# Patient Record
Sex: Female | Born: 1980 | Race: White | Hispanic: Yes | Marital: Married | Smoking: Former smoker
Health system: Southern US, Community
[De-identification: ages and names within clinical notes are randomized; demographics above are authoritative.]

## PROBLEM LIST (undated history)

## (undated) DIAGNOSIS — Z789 Other specified health status: Secondary | ICD-10-CM

## (undated) HISTORY — PX: BREAST SURGERY: SHX581

---

## 2014-12-16 LAB — OB RESULTS CONSOLE RPR: RPR: NONREACTIVE

## 2014-12-16 LAB — OB RESULTS CONSOLE HIV ANTIBODY (ROUTINE TESTING): HIV: NONREACTIVE

## 2014-12-16 LAB — OB RESULTS CONSOLE GC/CHLAMYDIA
CHLAMYDIA, DNA PROBE: NEGATIVE
Gonorrhea: NEGATIVE

## 2014-12-16 LAB — OB RESULTS CONSOLE ABO/RH: RH TYPE: NEGATIVE

## 2014-12-16 LAB — OB RESULTS CONSOLE HEPATITIS B SURFACE ANTIGEN: Hepatitis B Surface Ag: NEGATIVE

## 2014-12-16 LAB — OB RESULTS CONSOLE ANTIBODY SCREEN: Antibody Screen: NEGATIVE

## 2014-12-16 LAB — OB RESULTS CONSOLE RUBELLA ANTIBODY, IGM: Rubella: IMMUNE

## 2015-02-27 NOTE — L&D Delivery Note (Signed)
Pt complete and at +2 station with uncontrollable urge to push without pain medication.Marland Kitchen. Pt pushed for about 15 mins to deliver a viable female infant in ROA position over 2nd degree perineal laceration. Anterior and posterior shoulders spontaneously delivered with next two pushes; body easily followed next. Infant placed on mothers abdomen and bulb suction of mouth and nose performed. Cord was then clamped and cut by FOB after a minute delay. Cord blood obtained. Baby had a vigorous spontaneous cry noted. Placenta then delivered about 5 mins later intact, 3VC shultz. Fundal massage performed and pitocin per protocol. Fundus firm. Second degree lac repaired with 2-0 vicryl suture. Mother and baby stable. Counts correct. Apgars 9 and 9

## 2015-06-03 LAB — OB RESULTS CONSOLE GBS: STREP GROUP B AG: POSITIVE

## 2015-06-23 ENCOUNTER — Encounter (HOSPITAL_COMMUNITY): Payer: Self-pay | Admitting: *Deleted

## 2015-06-23 ENCOUNTER — Inpatient Hospital Stay (HOSPITAL_COMMUNITY)
Admission: AD | Admit: 2015-06-23 | Discharge: 2015-06-23 | Disposition: A | Discharge status: Home / Self Care | Payer: BLUE CROSS/BLUE SHIELD | Source: Intra-hospital | Attending: Obstetrics and Gynecology | Admitting: Obstetrics and Gynecology

## 2015-06-23 DIAGNOSIS — Z79899 Other long term (current) drug therapy: Secondary | ICD-10-CM | POA: Diagnosis not present

## 2015-06-23 DIAGNOSIS — B349 Viral infection, unspecified: Secondary | ICD-10-CM | POA: Diagnosis not present

## 2015-06-23 DIAGNOSIS — O98513 Other viral diseases complicating pregnancy, third trimester: Secondary | ICD-10-CM | POA: Insufficient documentation

## 2015-06-23 DIAGNOSIS — O479 False labor, unspecified: Secondary | ICD-10-CM

## 2015-06-23 DIAGNOSIS — R509 Fever, unspecified: Secondary | ICD-10-CM | POA: Diagnosis present

## 2015-06-23 DIAGNOSIS — Z87891 Personal history of nicotine dependence: Secondary | ICD-10-CM | POA: Insufficient documentation

## 2015-06-23 DIAGNOSIS — O471 False labor at or after 37 completed weeks of gestation: Secondary | ICD-10-CM | POA: Diagnosis not present

## 2015-06-23 DIAGNOSIS — Z3A37 37 weeks gestation of pregnancy: Secondary | ICD-10-CM | POA: Insufficient documentation

## 2015-06-23 HISTORY — DX: Other specified health status: Z78.9

## 2015-06-23 LAB — CBC WITH DIFFERENTIAL/PLATELET
BASOS ABS: 0 10*3/uL (ref 0.0–0.1)
BASOS PCT: 0 %
Eosinophils Absolute: 0 10*3/uL (ref 0.0–0.7)
Eosinophils Relative: 0 %
HEMATOCRIT: 33 % — AB (ref 36.0–46.0)
Hemoglobin: 11.4 g/dL — ABNORMAL LOW (ref 12.0–15.0)
Lymphocytes Relative: 7 %
Lymphs Abs: 0.9 10*3/uL (ref 0.7–4.0)
MCH: 30.2 pg (ref 26.0–34.0)
MCHC: 34.5 g/dL (ref 30.0–36.0)
MCV: 87.3 fL (ref 78.0–100.0)
Monocytes Absolute: 0.5 10*3/uL (ref 0.1–1.0)
Monocytes Relative: 4 %
NEUTROS ABS: 11.3 10*3/uL — AB (ref 1.7–7.7)
Neutrophils Relative %: 89 %
PLATELETS: 196 10*3/uL (ref 150–400)
RBC: 3.78 MIL/uL — AB (ref 3.87–5.11)
RDW: 13.5 % (ref 11.5–15.5)
WBC: 12.8 10*3/uL — AB (ref 4.0–10.5)

## 2015-06-23 LAB — URINALYSIS, ROUTINE W REFLEX MICROSCOPIC
Bilirubin Urine: NEGATIVE
GLUCOSE, UA: NEGATIVE mg/dL
Hgb urine dipstick: NEGATIVE
KETONES UR: 40 mg/dL — AB
LEUKOCYTES UA: NEGATIVE
NITRITE: NEGATIVE
PH: 6.5 (ref 5.0–8.0)
PROTEIN: NEGATIVE mg/dL
Specific Gravity, Urine: 1.01 (ref 1.005–1.030)

## 2015-06-23 MED ORDER — ONDANSETRON HCL 4 MG/2ML IJ SOLN
4.0000 mg | Freq: Once | INTRAMUSCULAR | Status: AC
  Administered 2015-06-23: 4 mg via INTRAVENOUS
  Filled 2015-06-23: qty 2

## 2015-06-23 MED ORDER — ONDANSETRON 8 MG PO TBDP: 8.0000 mg | ORAL_TABLET | Freq: Three times a day (TID) | ORAL | Status: DC | PRN

## 2015-06-23 MED ORDER — LACTATED RINGERS IV BOLUS (SEPSIS)
1000.0000 mL | Freq: Once | INTRAVENOUS | Status: AC
  Administered 2015-06-23: 1000 mL via INTRAVENOUS

## 2015-06-23 NOTE — MAU Note (Signed)
Patient presents at [redacted] weeks gestation with c/o irregular contractions X 2 days. Fetus active. Denies bleeding or discharge.

## 2015-06-23 NOTE — MAU Provider Note (Signed)
Chief Complaint:  Labor Eval   First Provider Initiated Contact with Patient 06/23/15 1704     HPI: Cindy Hendricks is a 35 y.o. G3P1011 at [redacted]w[redacted]d who presents to maternity admissions reporting contractions, low-grade fever, chills, decreased appetite x 2 days and nausea today. Able to drink water, but not much PO intake otherwise. Took Tylenol this morning at 0800.   Location: Low abd, low back Quality: cramping Severity: 8/10 in pain scale Duration: 2 days Course: worsening Timing: intermittent, every 4-5 minutes Modifying factors: Nothing. Hasn't tried anything for pain. Associated signs and symptoms: Pos for low grade fever, chills, body aches, nausea. Neg for LOF, VB, uterine tenderness, sore throat, respiratory complaints, diarrhea, constipation or other Sx of infection.   Good fetal movement.   Past Medical History: Past Medical History  Diagnosis Date  . Medical history non-contributory     Past obstetric history: OB History  Gravida Para Term Preterm AB SAB TAB Ectopic Multiple Living  # Outcome Date GA Lbr Len/2nd Weight Sex Delivery Anes PTL Lv  3 Current           2 Term 03/26/12     CS-LTranv     1 Ectopic 1999              Past Surgical History: Past Surgical History  Procedure Laterality Date  . Breast surgery    . Cesarean section       Family History: History reviewed. No pertinent family history.  Social History: Social History  Substance Use Topics  . Smoking status: Former Games developer  . Smokeless tobacco: Never Used  . Alcohol Use: No    Allergies: No Known Allergies  Meds:  Prescriptions prior to admission  Medication Sig Dispense Refill Last Dose  . acetaminophen (TYLENOL) 500 MG tablet Take 1,000 mg by mouth every 6 (six) hours as needed for mild pain or fever.    06/23/2015 at Unknown time  . ferrous sulfate 325 (65 FE) MG tablet Take 325 mg by mouth daily with breakfast.   06/23/2015 at Unknown time  . Prenatal  Vit-Fe Fumarate-FA (PRENATAL MULTIVITAMIN) TABS tablet Take 1 tablet by mouth daily at 12 noon.   06/23/2015 at Unknown time    I have reviewed patient's Past Medical Hx, Surgical Hx, Family Hx, Social Hx, medications and allergies.   ROS:  Review of Systems  Constitutional: Positive for fever, chills, appetite change and fatigue.  HENT: Negative for congestion, ear pain, rhinorrhea, sinus pressure and sore throat.   Respiratory: Negative for cough.   Gastrointestinal: Positive for nausea and abdominal pain. Negative for vomiting, diarrhea and constipation.  Genitourinary: Negative for dysuria, hematuria, flank pain, vaginal bleeding and vaginal discharge.  Musculoskeletal: Positive for myalgias and back pain (midline low back). Negative for neck pain.  Neurological: Negative for headaches.    Physical Exam   Patient Vitals for the past 24 hrs:  BP Temp Temp src Pulse Resp Height Weight  06/23/15 1823 (!) 103/54 mmHg 99.2 F (37.3 C) Oral 117 20 - -  06/23/15 1625 112/77 mmHg 99 F (37.2 C) Oral (!) 124 24  (1.575 m) 129 lb (58.514 kg)   Constitutional: Well-developed, well-nourished female in no acute distress. Tired-appearing.  Cardiovascular: Mild tachycardia Respiratory: normal effort GI: Abd soft, non-tender, gravid appropriate for gestational age.  MS: Extremities nontender, no edema, normal ROM. Mild sacral TTP.  Neurologic: Alert and oriented x 4.  GU: Neg CVAT.  Pelvic: NEFG, physiologic discharge, no blood.  Dilation: 1 Effacement (%): 50 Cervical Position: Middle Station:  (high) Presentation: Vertex Exam by:: Dorathy KinsmanVirginia Phoenyx Melka, CNM  FHT:  Baseline 150-170's , moderate variability, 15x15 accelerations present, no decelerations. Period of prolonged accels after vomiting. Returned to baseline on 150's.  Contractions: q 3-5 mins, moderate   Labs: Results for orders placed or performed during the hospital encounter of 06/23/15 (from the past 24 hour(s))  CBC with  Differential/Platelet     Status: Abnormal   Collection Time: 06/23/15  4:55 PM  Result Value Ref Range   WBC 12.8 (H) 4.0 - 10.5 K/uL   RBC 3.78 (L) 3.87 - 5.11 MIL/uL   Hemoglobin 11.4 (L) 12.0 - 15.0 g/dL   HCT 11.933.0 (L) 14.736.0 - 82.946.0 %   MCV 87.3 78.0 - 100.0 fL   MCH 30.2 26.0 - 34.0 pg   MCHC 34.5 30.0 - 36.0 g/dL   RDW 56.213.5 13.011.5 - 86.515.5 %   Platelets 196 150 - 400 K/uL   Neutrophils Relative % 89 %   Neutro Abs 11.3 (H) 1.7 - 7.7 K/uL   Lymphocytes Relative 7 %   Lymphs Abs 0.9 0.7 - 4.0 K/uL   Monocytes Relative 4 %   Monocytes Absolute 0.5 0.1 - 1.0 K/uL   Eosinophils Relative 0 %   Eosinophils Absolute 0.0 0.0 - 0.7 K/uL   Basophils Relative 0 %   Basophils Absolute 0.0 0.0 - 0.1 K/uL  Urinalysis, Routine w reflex microscopic (not [facility])     Status: Abnormal   Collection Time: 06/23/15  5:10 PM  Result Value Ref Range   Color, Urine YELLOW YELLOW   APPearance CLEAR CLEAR   Specific Gravity, Urine 1.010 1.005 - 1.030   pH 6.5 5.0 - 8.0   Glucose, UA NEGATIVE NEGATIVE mg/dL   Hgb urine dipstick NEGATIVE NEGATIVE   Bilirubin Urine NEGATIVE NEGATIVE   Ketones, ur 40 (A) NEGATIVE mg/dL   Protein, ur NEGATIVE NEGATIVE mg/dL   Nitrite NEGATIVE NEGATIVE   Leukocytes, UA NEGATIVE NEGATIVE    Imaging:  No results found.  MAU Course: Prolonged monitoring, CBC w/ dif, UA, LR bolus, Zofran.  No cervical change. Vomited once. Discussed Hx, exam, labs, VE, FHR tracing w/ Dr. Ellyn HackBovard who reviewed the tracing remotely. Suspect low-grade fever if from viral syndrome. Low suspicion for chorio or other emergent condition. Will not swab for flu to to absence of ILI Sx. D/C home w/ Phenergan or Zofran.   MDM: - Suspect low-grade fever if from viral syndrome. Low suspicion for chorio or other emergent condition. Mild leukocytosis normal for pregnancy.  - Fetal tachycardia resolved w/ fluid bolus.  - Braxton Hicks contractions. No evidence of active labor.   Assessment: 1. Fetal  tachycardia before the onset of labor   2. Acute viral syndrome   3. Braxton Hicks contractions     Plan: Discharge home in stable condition per consult w/ Dr. Ellyn HackBovard.  Labor precautions and fetal kick counts. Return to [facility] if unable to control fever w/ Tylenol.  ROM/Chorio precautions.      Follow-up Information    Follow up with Sherian ReinBovard-Stuckert, Jody, MD.   Specialty:  Obstetrics and Gynecology   Why:  Routine prenatal visit or sooner as needed if symptoms worsen   Contact information:   [address] [place] [address] [phone]       [facility] with [facility].  Why:  As needed if symptoms worsen   Contact information:   [address] [id number] mc [place] [address] [phone]        Medication List    TAKE these medications        acetaminophen 500 MG tablet  Commonly known as:  TYLENOL  Take 1,000 mg by mouth every 6 (six) hours as needed for mild pain or fever.     ferrous sulfate 325 (65 FE) MG tablet  Take 325 mg by mouth daily with breakfast.     ondansetron 8 MG disintegrating tablet  Commonly known as:  ZOFRAN ODT  Take 1 tablet (8 mg total) by mouth every 8 (eight) hours as needed for nausea or vomiting.     prenatal multivitamin Tabs tablet  Take 1 tablet by mouth daily at 12 noon.        [place], [place] 06/23/2015 6:53 PM

## 2015-06-23 NOTE — MAU Note (Signed)
Patient states she has been running a fever for the past couple of days and is taking tylenol prn.

## 2015-06-23 NOTE — Discharge Instructions (Signed)
Braxton Hicks Contractions Contractions of the uterus can occur throughout pregnancy. Contractions are not always a sign that you are in labor.  WHAT ARE BRAXTON HICKS CONTRACTIONS?  Contractions that occur before labor are called Braxton Hicks contractions, or false labor. Toward the end of pregnancy (32-34 weeks), these contractions can develop more often and may become more forceful. This is not true labor because these contractions do not result in opening (dilatation) and thinning of the cervix. They are sometimes difficult to tell apart from true labor because these contractions can be forceful and people have different pain tolerances. You should not feel embarrassed if you go to the hospital with false labor. Sometimes, the only way to tell if you are in true labor is for your health care provider to look for changes in the cervix. If there are no prenatal problems or other health problems associated with the pregnancy, it is completely safe to be sent home with false labor and await the onset of true labor. HOW CAN YOU TELL THE DIFFERENCE BETWEEN TRUE AND FALSE LABOR? False Labor  The contractions of false labor are usually shorter and not as hard as those of true labor.   The contractions are usually irregular.   The contractions are often felt in the front of the lower abdomen and in the groin.   The contractions may go away when you walk around or change positions while lying down.   The contractions get weaker and are shorter lasting as time goes on.   The contractions do not usually become progressively stronger, regular, and closer together as with true labor.  True Labor  Contractions in true labor last 30-70 seconds, become very regular, usually become more intense, and increase in frequency.   The contractions do not go away with walking.   The discomfort is usually felt in the top of the uterus and spreads to the lower abdomen and low back.   True labor can be  determined by your health care provider with an exam. This will show that the cervix is dilating and getting thinner.  WHAT TO REMEMBER  Keep up with your usual exercises and follow other instructions given by your health care provider.   Take medicines as directed by your health care provider.   Keep your regular prenatal appointments.   Eat and drink lightly if you think you are going into labor.   If Braxton Hicks contractions are making you uncomfortable:   Change your position from lying down or resting to walking, or from walking to resting.   Sit and rest in a tub of warm water.   Drink 2-3 glasses of water. Dehydration may cause these contractions.   Do slow and deep breathing several times an hour.  WHEN SHOULD I SEEK IMMEDIATE MEDICAL CARE? Seek immediate medical care if:  Your contractions become stronger, more regular, and closer together.   You have fluid leaking or gushing from your vagina.   You have a fever.   You pass blood-tinged mucus.   You have vaginal bleeding.   You have continuous abdominal pain.   You have low back pain that you never had before.   You feel your baby's head pushing down and causing pelvic pressure.   Your baby is not moving as much as it used to.    This information is not intended to replace advice given to you by your health care provider. Make sure you discuss any questions you have with your health care  provider. °  °Document Released: 02/12/2005 Document Revised: 02/17/2013 Document Reviewed: 11/24/2012 °Elsevier Interactive Patient Education ©2016 Elsevier Inc. ° °Viral Gastroenteritis °Viral gastroenteritis is also known as stomach flu. This condition affects the stomach and intestinal tract. It can cause sudden diarrhea and vomiting. The illness typically lasts 3 to 8 days. Most people develop an immune response that eventually gets rid of the virus. While this natural response develops, the virus can make  you quite ill. °CAUSES  °Many different viruses can cause gastroenteritis, such as rotavirus or noroviruses. You can catch one of these viruses by consuming contaminated food or water. You may also catch a virus by sharing utensils or other personal items with an infected person or by touching a contaminated surface. °SYMPTOMS  °The most common symptoms are diarrhea and vomiting. These problems can cause a severe loss of body fluids (dehydration) and a body salt (electrolyte) imbalance. Other symptoms may include: °· Fever. °· Headache. °· Fatigue. °· Abdominal pain. °DIAGNOSIS  °Your caregiver can usually diagnose viral gastroenteritis based on your symptoms and a physical exam. A stool sample may also be taken to test for the presence of viruses or other infections. °TREATMENT  °This illness typically goes away on its own. Treatments are aimed at rehydration. The most serious cases of viral gastroenteritis involve vomiting so severely that you are not able to keep fluids down. In these cases, fluids must be given through an intravenous line (IV). °HOME CARE INSTRUCTIONS  °· Drink enough fluids to keep your urine clear or pale yellow. Drink small amounts of fluids frequently and increase the amounts as tolerated. °· Ask your caregiver for specific rehydration instructions. °· Avoid: °¨ Foods high in sugar. °¨ Alcohol. °¨ Carbonated drinks. °¨ Tobacco. °¨ Juice. °¨ Caffeine drinks. °¨ Extremely hot or cold fluids. °¨ Fatty, greasy foods. °¨ Too much intake of anything at one time. °¨ Dairy products until 24 to 48 hours after diarrhea stops. °· You may consume probiotics. Probiotics are active cultures of beneficial bacteria. They may lessen the amount and number of diarrheal stools in adults. Probiotics can be found in yogurt with active cultures and in supplements. °· Wash your hands well to avoid spreading the virus. °· Only take over-the-counter or prescription medicines for pain, discomfort, or fever as directed  by your caregiver. Do not give aspirin to children. Antidiarrheal medicines are not recommended. °· Ask your caregiver if you should continue to take your regular prescribed and over-the-counter medicines. °· Keep all follow-up appointments as directed by your caregiver. °SEEK IMMEDIATE MEDICAL CARE IF:  °· You are unable to keep fluids down. °· You do not urinate at least once every 6 to 8 hours. °· You develop shortness of breath. °· You notice blood in your stool or vomit. This may look like coffee grounds. °· You have abdominal pain that increases or is concentrated in one small area (localized). °· You have persistent vomiting or diarrhea. °· You have a fever. °· The patient is a child younger than 3 months, and he or she has a fever. °· The patient is a child older than 3 months, and he or she has a fever and persistent symptoms. °· The patient is a child older than 3 months, and he or she has a fever and symptoms suddenly get worse. °· The patient is a baby, and he or she has no tears when crying. °MAKE SURE YOU:  °· Understand these instructions. °· Will watch your condition. °· Will get   help right away if you are not doing well or get worse.   This information is not intended to replace advice given to you by your health care provider. Make sure you discuss any questions you have with your health care provider.   Document Released: 02/12/2005 Document Revised: 05/07/2011 Document Reviewed: 11/29/2010 Elsevier Interactive Patient Education [employer].

## 2015-06-28 ENCOUNTER — Encounter (HOSPITAL_COMMUNITY): Payer: Self-pay | Admitting: *Deleted

## 2015-06-28 ENCOUNTER — Inpatient Hospital Stay (HOSPITAL_COMMUNITY)
Admission: AD | Admit: 2015-06-28 | Discharge: 2015-06-30 | DRG: 775 | Disposition: A | Discharge status: Home / Self Care | Payer: BLUE CROSS/BLUE SHIELD | Source: Ambulatory Visit | Attending: Obstetrics and Gynecology | Admitting: Obstetrics and Gynecology

## 2015-06-28 DIAGNOSIS — Z3A38 38 weeks gestation of pregnancy: Secondary | ICD-10-CM | POA: Diagnosis not present

## 2015-06-28 DIAGNOSIS — O99824 Streptococcus B carrier state complicating childbirth: Secondary | ICD-10-CM | POA: Diagnosis present

## 2015-06-28 DIAGNOSIS — D649 Anemia, unspecified: Secondary | ICD-10-CM | POA: Diagnosis present

## 2015-06-28 DIAGNOSIS — Z87891 Personal history of nicotine dependence: Secondary | ICD-10-CM

## 2015-06-28 DIAGNOSIS — O9902 Anemia complicating childbirth: Secondary | ICD-10-CM | POA: Diagnosis present

## 2015-06-28 DIAGNOSIS — O34211 Maternal care for low transverse scar from previous cesarean delivery: Secondary | ICD-10-CM | POA: Diagnosis present

## 2015-06-28 DIAGNOSIS — O34219 Maternal care for unspecified type scar from previous cesarean delivery: Secondary | ICD-10-CM | POA: Diagnosis present

## 2015-06-28 LAB — CBC
HCT: 33.1 % — ABNORMAL LOW (ref 36.0–46.0)
HCT: 23.8 % — ABNORMAL LOW (ref 36.0–46.0)
HEMOGLOBIN: 11.5 g/dL — AB (ref 12.0–15.0)
Hemoglobin: 8.3 g/dL — ABNORMAL LOW (ref 12.0–15.0)
MCH: 29.9 pg (ref 26.0–34.0)
MCH: 30 pg (ref 26.0–34.0)
MCHC: 34.7 g/dL (ref 30.0–36.0)
MCHC: 34.9 g/dL (ref 30.0–36.0)
MCV: 86.2 fL (ref 78.0–100.0)
MCV: 85.9 fL (ref 78.0–100.0)
PLATELETS: 157 10*3/uL (ref 150–400)
Platelets: 236 10*3/uL (ref 150–400)
RBC: 3.84 MIL/uL — AB (ref 3.87–5.11)
RBC: 2.77 MIL/uL — ABNORMAL LOW (ref 3.87–5.11)
RDW: 13.2 % (ref 11.5–15.5)
RDW: 13 % (ref 11.5–15.5)
WBC: 10.5 10*3/uL (ref 4.0–10.5)
WBC: 16.5 10*3/uL — AB (ref 4.0–10.5)

## 2015-06-28 LAB — RPR: RPR: NONREACTIVE

## 2015-06-28 LAB — TYPE AND SCREEN
ABO/RH(D): A NEG
Antibody Screen: NEGATIVE

## 2015-06-28 LAB — ABO/RH: ABO/RH(D): A NEG

## 2015-06-28 LAB — POCT FERN TEST: POCT Fern Test: POSITIVE

## 2015-06-28 MED ORDER — LIDOCAINE HCL (PF) 1 % IJ SOLN
30.0000 mL | INTRAMUSCULAR | Status: AC | PRN
  Administered 2015-06-28: 30 mL via SUBCUTANEOUS
  Filled 2015-06-28: qty 30

## 2015-06-28 MED ORDER — PHENYLEPHRINE 40 MCG/ML (10ML) SYRINGE FOR IV PUSH (FOR BLOOD PRESSURE SUPPORT)
PREFILLED_SYRINGE | INTRAVENOUS | Status: AC
  Filled 2015-06-28: qty 20

## 2015-06-28 MED ORDER — FENTANYL 2.5 MCG/ML BUPIVACAINE 1/10 % EPIDURAL INFUSION (WH - ANES)
INTRAMUSCULAR | Status: AC
  Filled 2015-06-28: qty 125

## 2015-06-28 MED ORDER — DIBUCAINE 1 % RE OINT: 1.0000 "application " | TOPICAL_OINTMENT | RECTAL | Status: DC | PRN

## 2015-06-28 MED ORDER — EPHEDRINE 5 MG/ML INJ
10.0000 mg | INTRAVENOUS | Status: DC | PRN
  Filled 2015-06-28: qty 2

## 2015-06-28 MED ORDER — IBUPROFEN 600 MG PO TABS
600.0000 mg | ORAL_TABLET | Freq: Four times a day (QID) | ORAL | Status: DC
  Administered 2015-06-28 – 2015-06-30 (×9): 600 mg via ORAL
  Filled 2015-06-28 (×9): qty 1

## 2015-06-28 MED ORDER — DIPHENHYDRAMINE HCL 25 MG PO CAPS: 25.0000 mg | ORAL_CAPSULE | Freq: Four times a day (QID) | ORAL | Status: DC | PRN

## 2015-06-28 MED ORDER — FENTANYL 2.5 MCG/ML BUPIVACAINE 1/10 % EPIDURAL INFUSION (WH - ANES): 14.0000 mL/h | INTRAMUSCULAR | Status: DC | PRN

## 2015-06-28 MED ORDER — ONDANSETRON HCL 4 MG/2ML IJ SOLN
4.0000 mg | Freq: Four times a day (QID) | INTRAMUSCULAR | Status: DC | PRN
  Filled 2015-06-28: qty 2

## 2015-06-28 MED ORDER — LACTATED RINGERS IV SOLN: INTRAVENOUS | Status: DC

## 2015-06-28 MED ORDER — OXYCODONE-ACETAMINOPHEN 5-325 MG PO TABS: 2.0000 | ORAL_TABLET | ORAL | Status: DC | PRN

## 2015-06-28 MED ORDER — IBUPROFEN 600 MG PO TABS: 600.0000 mg | ORAL_TABLET | Freq: Four times a day (QID) | ORAL | Status: DC | PRN

## 2015-06-28 MED ORDER — LACTATED RINGERS IV SOLN: 500.0000 mL | Freq: Once | INTRAVENOUS | Status: DC

## 2015-06-28 MED ORDER — WITCH HAZEL-GLYCERIN EX PADS: 1.0000 "application " | MEDICATED_PAD | CUTANEOUS | Status: DC | PRN

## 2015-06-28 MED ORDER — OXYTOCIN BOLUS FROM INFUSION: 500.0000 mL | INTRAVENOUS | Status: DC

## 2015-06-28 MED ORDER — ACETAMINOPHEN 325 MG PO TABS: 650.0000 mg | ORAL_TABLET | ORAL | Status: DC | PRN

## 2015-06-28 MED ORDER — SIMETHICONE 80 MG PO CHEW
80.0000 mg | CHEWABLE_TABLET | ORAL | Status: DC | PRN
Start: 2015-06-28 — End: 2015-06-30

## 2015-06-28 MED ORDER — PENICILLIN G POTASSIUM 5000000 UNITS IJ SOLR
2.5000 10*6.[IU] | INTRAVENOUS | Status: DC
  Filled 2015-06-28 (×2): qty 2.5

## 2015-06-28 MED ORDER — LACTATED RINGERS IV SOLN
500.0000 mL | INTRAVENOUS | Status: DC | PRN
  Administered 2015-06-28: 500 mL via INTRAVENOUS

## 2015-06-28 MED ORDER — PHENYLEPHRINE 40 MCG/ML (10ML) SYRINGE FOR IV PUSH (FOR BLOOD PRESSURE SUPPORT)
80.0000 ug | PREFILLED_SYRINGE | INTRAVENOUS | Status: DC | PRN
  Filled 2015-06-28: qty 5

## 2015-06-28 MED ORDER — SENNOSIDES-DOCUSATE SODIUM 8.6-50 MG PO TABS
2.0000 | ORAL_TABLET | ORAL | Status: DC
  Administered 2015-06-28 – 2015-06-29 (×2): 2 via ORAL
  Filled 2015-06-28 (×2): qty 2

## 2015-06-28 MED ORDER — OXYTOCIN 10 UNIT/ML IJ SOLN
2.5000 [IU]/h | INTRAVENOUS | Status: DC
  Administered 2015-06-28: 05:00:00 via INTRAVENOUS
  Filled 2015-06-28: qty 4

## 2015-06-28 MED ORDER — AMPICILLIN SODIUM 2 G IJ SOLR
2.0000 g | Freq: Once | INTRAMUSCULAR | Status: DC
  Filled 2015-06-28: qty 2000

## 2015-06-28 MED ORDER — DIPHENHYDRAMINE HCL 50 MG/ML IJ SOLN: 12.5000 mg | INTRAMUSCULAR | Status: DC | PRN

## 2015-06-28 MED ORDER — PRENATAL MULTIVITAMIN CH
1.0000 | ORAL_TABLET | Freq: Every day | ORAL | Status: DC
  Administered 2015-06-28 – 2015-06-30 (×3): 1 via ORAL
  Filled 2015-06-28 (×3): qty 1

## 2015-06-28 MED ORDER — TETANUS-DIPHTH-ACELL PERTUSSIS 5-2.5-18.5 LF-MCG/0.5 IM SUSP: 0.5000 mL | Freq: Once | INTRAMUSCULAR | Status: DC

## 2015-06-28 MED ORDER — DEXTROSE 5 % IV SOLN
5.0000 10*6.[IU] | Freq: Once | INTRAVENOUS | Status: AC
  Administered 2015-06-28: 5 10*6.[IU] via INTRAVENOUS
  Filled 2015-06-28: qty 5

## 2015-06-28 MED ORDER — SODIUM CHLORIDE 0.9 % IV SOLN
2.0000 g | Freq: Once | INTRAVENOUS | Status: DC
  Filled 2015-06-28: qty 2000

## 2015-06-28 MED ORDER — ZOLPIDEM TARTRATE 5 MG PO TABS: 5.0000 mg | ORAL_TABLET | Freq: Every evening | ORAL | Status: DC | PRN

## 2015-06-28 MED ORDER — ONDANSETRON HCL 4 MG PO TABS: 4.0000 mg | ORAL_TABLET | ORAL | Status: DC | PRN

## 2015-06-28 MED ORDER — OXYCODONE-ACETAMINOPHEN 5-325 MG PO TABS
1.0000 | ORAL_TABLET | ORAL | Status: DC | PRN
  Administered 2015-06-28: 1 via ORAL
  Filled 2015-06-28: qty 1

## 2015-06-28 MED ORDER — ONDANSETRON HCL 4 MG/2ML IJ SOLN: 4.0000 mg | INTRAMUSCULAR | Status: DC | PRN

## 2015-06-28 MED ORDER — COCONUT OIL OIL: 1.0000 "application " | TOPICAL_OIL | Status: DC | PRN

## 2015-06-28 MED ORDER — BENZOCAINE-MENTHOL 20-0.5 % EX AERO
1.0000 "application " | INHALATION_SPRAY | CUTANEOUS | Status: DC | PRN
  Administered 2015-06-28: 1 via TOPICAL
  Filled 2015-06-28: qty 56

## 2015-06-28 MED ORDER — CITRIC ACID-SODIUM CITRATE 334-500 MG/5ML PO SOLN: 30.0000 mL | ORAL | Status: DC | PRN

## 2015-06-28 NOTE — Lactation Note (Addendum)
This note was copied from a baby's chart. Lactation Consultation Note  Patient Name: Cindy Everlene BallsLaura Pereira Conde ZOXWR'UToday's Date: 06/28/2015 Reason for consult: Initial assessment   With this mom of a term baby, now 6 hours old and birth weight 5 lbs. 14 oz. Baby was asleep skin to skin, with mom. Mom allowed me to assist her with latching the baby, after hand expression. The baby latched after a minute and stayed latched for 20 minutes, with good, rhythmic suckles and visible swallows, needing stimulation to continue suckles.Mom encouraged to attempt to feed the baby at least every 3 hours, and ore often if baby showing hunger cues.  Basic teaching done on breast feeding from the Baby and me book, and lactation folder. DEP set up, for mom to begin pumping, to protect her milk supply and provide EBm for the baby, if she begins to get too tired to feed. I fitted mom with 21 flanges, and showed her how to set initiation setting on pump. Mom will call after doing skin to skin, if she needs help with pumping. Mom advised to pump for 15 minutes, every 3 hours, and offer EBm to baby. Mode of feeding this milk not discussed yet. Mom knows to call for questions/concerns.                  Important to note, this mom had breast implants done 'Years ago". and has scars where her areolas were removed, and a scar under her breast on the left side ( did not see the right at this time). Mom reports she did have milk with her 153 year old, but had to stop breast feeding when her husband was in an accident, and she lost her supply.    Maternal Data Formula Feeding for Exclusion: No Has patient been taught Hand Expression?: Yes Does the patient have breastfeeding experience prior to this delivery?: Yes  Feeding Feeding Type: Breast Fed Length of feed: 20 min  LATCH Score/Interventions Latch: Repeated attempts needed to sustain latch, nipple held in mouth throughout feeding, stimulation needed to elicit sucking  reflex. Intervention(s): Adjust position;Assist with latch;Breast compression  Audible Swallowing: A few with stimulation (easily expressed drops of colostrum with hand expression) Intervention(s): Skin to skin;Hand expression  Type of Nipple: Everted at rest and after stimulation (small, short shaft, breast scar around areola from breast implants years age, a per mom)  Comfort (Breast/Nipple): Soft / non-tender     Hold (Positioning): Assistance needed to correctly position infant at breast and maintain latch. Intervention(s): Breastfeeding basics reviewed;Support Pillows;Position options;Skin to skin  LATCH Score: 7  Lactation Tools Discussed/Used Pump Review: Setup, frequency, and cleaning;Milk Storage;Other (comment) (hand expression and initiation setting) Initiated by:: c Sabra Hecklee Rn, IBCLC Date initiated:: 06/28/15   Consult Status Consult Status: Follow-up Date: 06/29/15 Follow-up type: In-patient    Alfred LevinsLee, Symphoni Helbling Anne 06/28/2015, 12:20 PM

## 2015-06-28 NOTE — MAU Note (Signed)
PT SAYS SHE STARTED  HURTING  BAD AT .     VE -    1 CM.   GBS- POSITIVE      WANTS    VAG DEL.   OTHER BABY   IS C/S.

## 2015-06-28 NOTE — Procedures (Signed)
Pt oob to bathroom voided sufficient amount fundus remained firm but had a steady trickle of bld. Pt became light headed transferred back to bed via steady restarted fluids vital signs now stable pt feeling better. Fundus firm minimal bleeding

## 2015-06-28 NOTE — Progress Notes (Signed)
Patient ID: Cindy Hendricks, female   DOB: 11-21-1980, 35 y.o.   MRN: [id number] Pt had two episodes of lightheadness when attempt made to have her stand.  Bleeding noted to be appropriate and fundus firm Slight decrease in BP noted when checked from laying to sitting but not enough for Positive tilt test Pt encouraged to eat/drink Recheck done with her sitting about 30-1945mmins later and results even more stable Ok to move to pp room.  Continue fall precautions

## 2015-06-28 NOTE — H&P (Signed)
Cindy BallsLaura Pereira Hendricks is a 35 y.o. female presenting for painful contractions. Pt states has had intermittent irregular contractions for at least a week however since 2am today (2-3hours ago) they begun to get regular and painful. Pt also states has had some slight leakage of fluid since 3pm yesterday - no gush - so was unsure if water broke; in MAU pt is found to be + for ferning. She reiterates desire for VBAC  Maternal Medical History:  Reason for admission: Rupture of membranes and contractions.  Nausea.  Contractions: Onset was 1-2 hours ago.   Frequency: regular.   Duration is approximately 45 seconds.   Perceived severity is strong.    Fetal activity: Perceived fetal activity is normal.   Last perceived fetal movement was within the past hour.    Prenatal complications: no prenatal complications Prenatal Complications - Diabetes: none.    OB History    Gravida Para Term Preterm AB TAB SAB Ectopic Multiple Living   3 1 1  1   1  1      Past Medical History  Diagnosis Date  . Medical history non-contributory    Past Surgical History  Procedure Laterality Date  . Breast surgery    . Cesarean section     Family History: family history is not on file. Social History:  reports that she has quit smoking. She has never used smokeless tobacco. She reports that she does not drink alcohol or use illicit drugs.   Prenatal Transfer Tool  Maternal Diabetes: No Genetic Screening: Normal -panorama low risk Maternal Ultrasounds/Referrals: Normal previa - resolved Fetal Ultrasounds or other Referrals:  None Maternal Substance Abuse:  No Significant Maternal Medications:  None Significant Maternal Lab Results:  Lab values include: Group B Strep positive Other Comments:  None  Review of Systems  Constitutional: Negative for fever, chills, weight loss and malaise/fatigue.  Eyes: Negative for blurred vision and double vision.  Respiratory: Negative for shortness of breath.    Cardiovascular: Negative for chest pain.  Gastrointestinal: Positive for abdominal pain. Negative for heartburn, nausea and vomiting.  Genitourinary: Negative for dysuria.  Musculoskeletal: Positive for back pain.  Skin: Negative for itching and rash.  Neurological: Negative for dizziness, focal weakness and headaches.  Psychiatric/Behavioral: Negative for depression, suicidal ideas and substance abuse. The patient is not nervous/anxious.     Dilation: 1.5 Effacement (%): 90 Station: -1 Exam by:: LYNETTE, RN   Blood pressure 131/75, pulse 85. Maternal Exam:  Uterine Assessment: Contraction strength is firm.  Contraction duration is 45 seconds. Contraction frequency is regular.   Abdomen: Patient reports generalized tenderness.  Surgical scars: low transverse.   Estimated fetal weight is 20%ile.   Fetal presentation: vertex  Introitus: Normal vulva. Normal vagina.  Ferning test: positive.  Amniotic fluid character: bloody.  Pelvis: adequate for delivery.   Cervix: Cervix evaluated by digital exam.     Physical Exam  Constitutional: She is oriented to person, place, and time. She appears well-developed and well-nourished.  Neck: Normal range of motion.  Cardiovascular:  tachycardic  Respiratory: Effort normal.  GI: There is generalized tenderness.  Genitourinary: Vagina normal and uterus normal.  Musculoskeletal: Normal range of motion. She exhibits no edema.  Neurological: She is alert and oriented to person, place, and time.  Skin: Skin is warm.  Psychiatric: She has a normal mood and affect. Her behavior is normal. Judgment and thought content normal.    Prenatal labs: ABO, Rh: A/Negative/-- (10/20 0000) Antibody: Negative (10/20 0000) Rubella: Immune (  10/20 0000) RPR: Nonreactive (10/20 0000)  HBsAg: Negative (10/20 0000)  HIV: Non-reactive (10/20 0000)  GBS: Positive (04/07 0000)   Assessment/Plan: 34yo G3P1011 at 38 4/[redacted]wks gestation, hx LTC/S, in active labor  - desires VBAC Will admit for expectant management Epidural for pain relief PCN for GBS treatment Reiterated r/b of VBAC to pt - and to husband; consent to proceed verbalized   Edwinna Areola 06/28/2015, 4:25 AM

## 2015-06-28 NOTE — Progress Notes (Signed)
Patient stated she did not need my help and would order her own meals. Cindy Hendricks Interpreter.

## 2015-06-29 LAB — CBC
HCT: 21.1 % — ABNORMAL LOW (ref 36.0–46.0)
Hemoglobin: 7.2 g/dL — ABNORMAL LOW (ref 12.0–15.0)
MCH: 29.9 pg (ref 26.0–34.0)
MCHC: 34.1 g/dL (ref 30.0–36.0)
MCV: 87.6 fL (ref 78.0–100.0)
PLATELETS: 186 10*3/uL (ref 150–400)
RBC: 2.41 MIL/uL — AB (ref 3.87–5.11)
RDW: 13.6 % (ref 11.5–15.5)
WBC: 13.8 10*3/uL — ABNORMAL HIGH (ref 4.0–10.5)

## 2015-06-29 MED ORDER — BISACODYL 10 MG RE SUPP
10.0000 mg | Freq: Once | RECTAL | Status: AC
  Administered 2015-06-29: 10 mg via RECTAL
  Filled 2015-06-29: qty 1

## 2015-06-29 MED ORDER — RHO D IMMUNE GLOBULIN 1500 UNIT/2ML IJ SOSY
300.0000 ug | PREFILLED_SYRINGE | Freq: Once | INTRAMUSCULAR | Status: AC
  Administered 2015-06-29: 300 ug via INTRAVENOUS
  Filled 2015-06-29: qty 2

## 2015-06-29 NOTE — Lactation Note (Addendum)
This note was copied from a baby's chart. Lactation Consultation Note  Patient Name: Cindy Everlene BallsLaura Pereira Conde GNFAO'ZToday's Date: 06/29/2015 Reason for consult: Follow-up assessment Baby at 39 hr of life and mom reports bf is going well. Baby was comfortably latched upon entry. She denies breast or nipple pain, voiced no concerns. Mom will offer the breast on demand 8+/24 hr and f/u each feeding with expressed milk on the nipples. She is aware of lactation services and support group. She will call as needed for bf support.    Maternal Data    Feeding Feeding Type: Breast Fed Length of feed: 20 min  LATCH Score/Interventions                      Lactation Tools Discussed/Used     Consult Status Consult Status: PRN    Rulon Eisenmengerlizabeth E Norlene Lanes 06/29/2015, 8:29 PM

## 2015-06-29 NOTE — Lactation Note (Signed)
This note was copied from a baby's chart. Lactation Consultation Note Baby feeding well. Has had 2 voids and 3 stools at 13 hrs of age. Only had a 2 % weight loss.baby weight 5.11 Patient Name: Cindy Hendricks ZOXWR'UToday's Date: 06/29/2015 Reason for consult: Follow-up assessment;Infant < 6lbs   Maternal Data    Feeding Feeding Type: Breast Fed Length of feed: 15 min  LATCH Score/Interventions                      Lactation Tools Discussed/Used     Consult Status Consult Status: Follow-up Date: 06/29/15 (in pm) Follow-up type: In-patient    Miray Mancino, Diamond NickelLAURA G 06/29/2015, 6:02 AM

## 2015-06-29 NOTE — Progress Notes (Addendum)
Post Partum Day 1 Subjective: no complaints, up ad lib, voiding, tolerating PO, + flatus and no more lightheadedness with ambulating. Bonding well with baby - breastfeeding  Objective: Blood pressure 106/61, pulse 91, temperature 98.4 F (36.9 C), temperature source Oral, resp. rate 18, height 5\' 2"  (1.575 m), weight 129 lb (58.514 kg), SpO2 100 %, unknown if currently breastfeeding.  Physical Exam:  General: alert, cooperative and no distress Lochia: appropriate Uterine Fundus: firm Incision: n/a DVT Evaluation: No evidence of DVT seen on physical exam. No significant calf/ankle edema.   Recent Labs  06/28/15 0340 06/28/15 1200  HGB 11.5* 8.3*  HCT 33.1* 23.8*    Assessment/Plan: Plan for discharge tomorrow and Breastfeeding  Recheck cbc today   LOS: 1 day   [facility] CenterCecilia Worema Violet Cart 06/29/2015, 8:27 AM

## 2015-06-30 LAB — CBC
HCT: 22.6 % — ABNORMAL LOW (ref 36.0–46.0)
Hemoglobin: 7.6 g/dL — ABNORMAL LOW (ref 12.0–15.0)
MCH: 29.6 pg (ref 26.0–34.0)
MCHC: 33.6 g/dL (ref 30.0–36.0)
MCV: 87.9 fL (ref 78.0–100.0)
PLATELETS: 251 10*3/uL (ref 150–400)
RBC: 2.57 MIL/uL — ABNORMAL LOW (ref 3.87–5.11)
RDW: 13.6 % (ref 11.5–15.5)
WBC: 9.8 10*3/uL (ref 4.0–10.5)

## 2015-06-30 LAB — RH IG WORKUP (INCLUDES ABO/RH)
ABO/RH(D): A NEG
FETAL SCREEN: NEGATIVE
Gestational Age(Wks): 38.4
UNIT DIVISION: 0

## 2015-06-30 MED ORDER — IBUPROFEN 600 MG PO TABS: 600.0000 mg | ORAL_TABLET | Freq: Four times a day (QID) | ORAL | Status: DC | PRN

## 2015-06-30 NOTE — Discharge Summary (Signed)
OB Discharge Summary     Patient Name: Cindy Hendricks DOB: January 06, 1981 MRN: [id number]  Date of admission: 06/28/2015 Delivering MD: Pryor OchoaBANGA, Shadai Mcclane [facility]   Date of discharge: 06/30/2015  Admitting diagnosis: 39 WEEKS CTX Intrauterine pregnancy: 5174w4d     Secondary diagnosis:  Active Problems:   Maternal care for scar from previous cesarean delivery   VBAC, delivered, current hospitalization  Additional problems: anemia     Discharge diagnosis: Term Pregnancy Delivered and VBAC                                                                                                Post partum procedures:none  Augmentation: none  Complications: None  Hospital course:  Onset of Labor With Vaginal Delivery (precipitous VBAC)     35 y.o. yo G3P2011 at 7474w4d was admitted in Active Labor on 06/28/2015. Patient had an uncomplicated labor course as follows:  Membrane Rupture Time/Date: 3:00 PM ,06/27/2015   Intrapartum Procedures: Episiotomy: None [1]                                         Lacerations:  2nd degree [3]  Patient had a delivery of a Viable infant. 06/28/2015  Information for the patient's newborn:  Cindy Hendricks, Girl Vernona RiegerLaura [[id number]][[id number]]  Delivery Method: Vaginal, Spontaneous Delivery (Filed from Delivery Summary)    Pateint had an uncomplicated postpartum course.  She is ambulating, tolerating a regular diet, passing flatus, and urinating well. Patient is discharged home in stable condition on 06/30/2015.    Physical exam  Filed Vitals:   06/28/15 1715 06/29/15 0603 06/29/15 1755 06/30/15 0546  BP: 115/64 106/61 105/67 90/60  Pulse: 106 91 115 92  Temp: 98.5 F (36.9 C) 98.4 F (36.9 C) 99 F (37.2 C) 98.6 F (37 C)  TempSrc: Oral Oral Oral   Resp: 24 18 19    Height:      Weight:      SpO2:       General: alert, cooperative and no distress Lochia: appropriate Uterine Fundus: firm Incision: N/A DVT Evaluation: No evidence of DVT seen on physical exam. No  significant calf/ankle edema. Labs: Lab Results  Component Value Date   WBC 13.8* 06/29/2015   HGB 7.2* 06/29/2015   HCT 21.1* 06/29/2015   MCV 87.6 06/29/2015   PLT 186 06/29/2015   No flowsheet data found.  Discharge instruction: per After Visit Summary and "Baby and Me Booklet".  After visit meds:    Medication List    ASK your doctor about these medications        acetaminophen 500 MG tablet  Commonly known as:  TYLENOL  Take 1,000 mg by mouth every 6 (six) hours as needed for mild pain or fever.     ferrous sulfate 325 (65 FE) MG tablet  Take 325 mg by mouth daily with breakfast.     prenatal multivitamin Tabs tablet  Take 1 tablet by mouth daily at 12 noon.  Diet: routine diet  Activity: Advance as tolerated. Pelvic rest for 6 weeks.   Outpatient follow up:6 weeks Follow up Appt:Future Appointments Date Time Provider Department Center  07/07/2015 8:00 AM [facility] PAT 5 [facility] None   Follow up Visit:No Follow-up on file.  Postpartum contraception: Undecided  Newborn Data: Live born female  Birth Weight: 5 lb 14 oz (2665 g) APGAR: 9, 10  Baby Feeding: Breast Disposition:home with mother   06/30/2015 Sharol Given Cindy Nichelson, DO

## 2015-06-30 NOTE — Lactation Note (Signed)
This note was copied from a baby's chart. Lactation Consultation Note  Patient Name: Cindy Everlene BallsLaura Pereira Conde XBJYN'WToday's Date: 06/30/2015 Reason for consult: Follow-up assessment   With this mom of a term but small baby, now 4052 hours old, and weight 5 lbs 10 oz, and at 4% weight loss. Mom is full, and I advised her to pump every 3 hours, to protect her milk supply. She pumped 16 ml's, which she bottle fed to the baby, and she tolerated well. Mom has a DEP at home, and knows to pump around the clock. Mom knows to call for questions/concerns.    Maternal Data    Feeding Feeding Type: Breast Milk Length of feed: 30 min  LATCH Score/Interventions Latch: Repeated attempts needed to sustain latch, nipple held in mouth throughout feeding, stimulation needed to elicit sucking reflex. (mom had a shallow latch, positioned baby deeper, comfortable)  Audible Swallowing: A few with stimulation  Type of Nipple: Everted at rest and after stimulation  Comfort (Breast/Nipple): Filling, red/small blisters or bruises, mild/mod discomfort  Problem noted: Mild/Moderate discomfort Interventions (Mild/moderate discomfort): Comfort gels  Hold (Positioning): Assistance needed to correctly position infant at breast and maintain latch.  LATCH Score: 6  Lactation Tools Discussed/Used Tools: Pump Breast pump type: Double-Electric Breast Pump   Consult Status Consult Status: Complete Follow-up type: Call as needed    Alfred LevinsLee, Gwendy Boeder Anne 06/30/2015, 10:40 AM

## 2015-06-30 NOTE — Progress Notes (Signed)
Post Partum Day 2 Subjective: no complaints, up ad lib, voiding, tolerating PO and breastfeeding. Denies any lightheadedness with ambulating. Ready for discharge to home today  Objective: Blood pressure 90/60, pulse 92, temperature 98.6 F (37 C), temperature source Oral, resp. rate 19, height 5\' 2"  (1.575 m), weight 129 lb (58.514 kg), SpO2 100 %, unknown if currently breastfeeding.  Physical Exam:  General: alert, cooperative and no distress Lochia: appropriate Uterine Fundus: firm DVT Evaluation: No evidence of DVT seen on physical exam. No significant calf/ankle edema.   Recent Labs  06/28/15 1200 06/29/15 0530  HGB 8.3* 7.2*  HCT 23.8* 21.1*    Assessment/Plan: Discharge home, Breastfeeding and Contraception undecided  Willa wait results for cbc at noon today 6 week f/u   LOS: 2 days   Sharol Givenecilia Worema Tiron Suski 06/30/2015, 8:57 AM

## 2015-06-30 NOTE — Discharge Instructions (Signed)
Nothing in vagina for 6 weeks.  No sex, tampons, and douching.  Other instructions as in [facility] Discharge Booklet. °

## 2015-07-07 ENCOUNTER — Inpatient Hospital Stay (HOSPITAL_COMMUNITY): Admission: RE | Admit: 2015-07-07 | Payer: BLUE CROSS/BLUE SHIELD | Source: Ambulatory Visit

## 2015-07-08 ENCOUNTER — Encounter (HOSPITAL_COMMUNITY)
Admission: AD | Disposition: A | Discharge status: Home / Self Care | Payer: Self-pay | Source: Ambulatory Visit | Attending: Obstetrics and Gynecology

## 2015-07-08 SURGERY — Surgical Case
Anesthesia: Regional

## 2015-07-17 ENCOUNTER — Inpatient Hospital Stay (HOSPITAL_COMMUNITY)
Admission: AD | Admit: 2015-07-17 | Discharge: 2015-07-19 | DRG: 776 | Disposition: A | Discharge status: Home / Self Care | Payer: BLUE CROSS/BLUE SHIELD | Source: Ambulatory Visit | Attending: Obstetrics and Gynecology | Admitting: Obstetrics and Gynecology

## 2015-07-17 ENCOUNTER — Encounter (HOSPITAL_COMMUNITY): Payer: Self-pay

## 2015-07-17 DIAGNOSIS — Z833 Family history of diabetes mellitus: Secondary | ICD-10-CM | POA: Diagnosis not present

## 2015-07-17 DIAGNOSIS — O479 False labor, unspecified: Secondary | ICD-10-CM

## 2015-07-17 DIAGNOSIS — O9122 Nonpurulent mastitis associated with the puerperium: Principal | ICD-10-CM | POA: Diagnosis present

## 2015-07-17 DIAGNOSIS — Z9882 Breast implant status: Secondary | ICD-10-CM

## 2015-07-17 DIAGNOSIS — N61 Mastitis without abscess: Secondary | ICD-10-CM | POA: Diagnosis present

## 2015-07-17 DIAGNOSIS — Z87891 Personal history of nicotine dependence: Secondary | ICD-10-CM | POA: Diagnosis not present

## 2015-07-17 DIAGNOSIS — B349 Viral infection, unspecified: Secondary | ICD-10-CM

## 2015-07-17 LAB — CBC WITH DIFFERENTIAL/PLATELET
Basophils Absolute: 0 10*3/uL (ref 0.0–0.1)
Basophils Relative: 0 %
EOS ABS: 0.3 10*3/uL (ref 0.0–0.7)
Eosinophils Relative: 3 %
HEMATOCRIT: 30.1 % — AB (ref 36.0–46.0)
HEMOGLOBIN: 9.9 g/dL — AB (ref 12.0–15.0)
LYMPHS ABS: 1.1 10*3/uL (ref 0.7–4.0)
Lymphocytes Relative: 9 %
MCH: 28.8 pg (ref 26.0–34.0)
MCHC: 32.9 g/dL (ref 30.0–36.0)
MCV: 87.5 fL (ref 78.0–100.0)
MONO ABS: 0.2 10*3/uL (ref 0.1–1.0)
MONOS PCT: 1 %
NEUTROS ABS: 10.1 10*3/uL — AB (ref 1.7–7.7)
NEUTROS PCT: 86 %
Platelets: 252 10*3/uL (ref 150–400)
RBC: 3.44 MIL/uL — ABNORMAL LOW (ref 3.87–5.11)
RDW: 13.4 % (ref 11.5–15.5)
WBC: 11.6 10*3/uL — ABNORMAL HIGH (ref 4.0–10.5)

## 2015-07-17 MED ORDER — ALUM & MAG HYDROXIDE-SIMETH 200-200-20 MG/5ML PO SUSP: 30.0000 mL | ORAL | Status: DC | PRN

## 2015-07-17 MED ORDER — IBUPROFEN 800 MG PO TABS: 800.0000 mg | ORAL_TABLET | Freq: Three times a day (TID) | ORAL | Status: DC | PRN

## 2015-07-17 MED ORDER — LACTATED RINGERS IV SOLN
INTRAVENOUS | Status: DC
  Administered 2015-07-17: 14:00:00 via INTRAVENOUS

## 2015-07-17 MED ORDER — LACTATED RINGERS IV BOLUS (SEPSIS)
1000.0000 mL | Freq: Once | INTRAVENOUS | Status: AC
  Administered 2015-07-17: 1000 mL via INTRAVENOUS

## 2015-07-17 MED ORDER — IBUPROFEN 600 MG PO TABS
600.0000 mg | ORAL_TABLET | Freq: Four times a day (QID) | ORAL | Status: DC
  Administered 2015-07-17 – 2015-07-19 (×7): 600 mg via ORAL
  Filled 2015-07-17 (×7): qty 1

## 2015-07-17 MED ORDER — OXYCODONE-ACETAMINOPHEN 5-325 MG PO TABS
2.0000 | ORAL_TABLET | Freq: Once | ORAL | Status: DC
  Administered 2015-07-17: 2 via ORAL
  Filled 2015-07-17: qty 2

## 2015-07-17 MED ORDER — OXYCODONE-ACETAMINOPHEN 5-325 MG PO TABS: 1.0000 | ORAL_TABLET | ORAL | Status: DC | PRN

## 2015-07-17 MED ORDER — PRENATAL MULTIVITAMIN CH
1.0000 | ORAL_TABLET | Freq: Every day | ORAL | Status: DC
  Administered 2015-07-17 – 2015-07-19 (×3): 1 via ORAL
  Filled 2015-07-17 (×4): qty 1

## 2015-07-17 MED ORDER — MENTHOL 3 MG MT LOZG
1.0000 | LOZENGE | OROMUCOSAL | Status: DC | PRN
  Filled 2015-07-17: qty 9

## 2015-07-17 MED ORDER — SIMETHICONE 80 MG PO CHEW
80.0000 mg | CHEWABLE_TABLET | Freq: Four times a day (QID) | ORAL | Status: DC | PRN
  Filled 2015-07-17: qty 1

## 2015-07-17 MED ORDER — IBUPROFEN 600 MG PO TABS: 600.0000 mg | ORAL_TABLET | Freq: Once | ORAL | Status: DC

## 2015-07-17 MED ORDER — ONDANSETRON HCL 4 MG/2ML IJ SOLN: 4.0000 mg | Freq: Four times a day (QID) | INTRAMUSCULAR | Status: DC | PRN

## 2015-07-17 MED ORDER — ONDANSETRON HCL 4 MG PO TABS
4.0000 mg | ORAL_TABLET | Freq: Four times a day (QID) | ORAL | Status: DC | PRN
Start: 2015-07-17 — End: 2015-07-19

## 2015-07-17 MED ORDER — ACETAMINOPHEN 325 MG PO TABS
650.0000 mg | ORAL_TABLET | Freq: Four times a day (QID) | ORAL | Status: DC | PRN
  Administered 2015-07-17: 650 mg via ORAL
  Filled 2015-07-17: qty 2

## 2015-07-17 MED ORDER — GUAIFENESIN 100 MG/5ML PO SOLN: 15.0000 mL | ORAL | Status: DC | PRN

## 2015-07-17 MED ORDER — VANCOMYCIN HCL IN DEXTROSE 1-5 GM/200ML-% IV SOLN
1000.0000 mg | Freq: Two times a day (BID) | INTRAVENOUS | Status: DC
  Administered 2015-07-17 – 2015-07-19 (×5): 1000 mg via INTRAVENOUS
  Filled 2015-07-17 (×6): qty 200

## 2015-07-17 MED ORDER — OXYCODONE-ACETAMINOPHEN 5-325 MG PO TABS
2.0000 | ORAL_TABLET | ORAL | Status: DC | PRN
  Administered 2015-07-17: 1 via ORAL
  Filled 2015-07-17: qty 2

## 2015-07-17 MED ORDER — DOCUSATE SODIUM 100 MG PO CAPS
100.0000 mg | ORAL_CAPSULE | Freq: Two times a day (BID) | ORAL | Status: DC
  Administered 2015-07-17 – 2015-07-19 (×5): 100 mg via ORAL
  Filled 2015-07-17 (×6): qty 1

## 2015-07-17 NOTE — MAU Note (Signed)
Patient presents with breast pain being treated for mastitis since Friday with no improvement.

## 2015-07-17 NOTE — H&P (Signed)
Cindy Hendricks is an 35 y.o. female [id number] s/p VBAC 06/28/15.  Presents with red, sore breast despite starting Dicloxacillin for mastitis Friday.  Pt with fever and chills.  L breast is somewhat improved, R is much worse.  D/W pharmacy antibiotic treatment.  Given poss of MRSA, pt to be treated with Vancomycin.  D/c oral bactrim or clindamycin.     Pertinent Gynecological History: Menses: lochia, s/p VBAC 06/28/15 Last pap: normal Date: 4/16 OB History: G3, P2012 G1 ectopic G2 LTCS 03/26/12, [place], female G3 VBAC 06/28/15, female - Panorama Low Risk  Menstrual History: No LMP recorded.    Past Medical History  Diagnosis Date  . Medical history non-contributory     Past Surgical History  Procedure Laterality Date  . Breast surgery    . Cesarean section     (breast implant) T&S, hernia repair, knee sx  FH: Br CA, MI, DM  Social History:  reports that she has quit smoking. She has never used smokeless tobacco. She reports that she does not drink alcohol or use illicit drugs. SAHM, married  Allergies: No Known Allergies  Prescriptions prior to admission  Medication Sig Dispense Refill Last Dose         . dicloxacillin (DYNAPEN) 500 MG capsule Take 500 mg by mouth 4 (four) times daily.   07/15/2015  . ferrous sulfate 325 (65 FE) MG tablet Take 325 mg by mouth daily with breakfast.   07/17/2015 at Unknown time  . ibuprofen (ADVIL,MOTRIN) 600 MG tablet Take 1 tablet (600 mg total) by mouth every 6 (six) hours as needed for mild pain, moderate pain or cramping. 60 tablet 1 07/16/2015 at Unknown time  . Prenatal Vit-Fe Fumarate-FA (PRENATAL MULTIVITAMIN) TABS tablet Take 1 tablet by mouth daily at 12 noon.    07/16/2015 at Unknown time    Review of Systems  Constitutional: Positive for fever, chills and malaise/fatigue.  HENT: Negative.   Eyes: Negative.   Respiratory: Negative.   Cardiovascular: Negative.   Gastrointestinal: Negative.   Genitourinary: Negative.    Musculoskeletal: Positive for myalgias.  Skin: Negative.   Neurological: Negative.   Psychiatric/Behavioral: Negative.     Blood pressure 108/92, pulse 143, temperature 102.7 F (39.3 C), temperature source Oral, resp. rate 20, height 5' 1.71" (1.567 m), weight 53.524 kg (118 lb), SpO2 100 %, unknown if currently breastfeeding. Physical Exam  Constitutional: She is oriented to person, place, and time. She appears well-developed and well-nourished.  HENT:  Head: Normocephalic and atraumatic.  Cardiovascular: Normal rate and regular rhythm.   Respiratory: Effort normal and breath sounds normal. No respiratory distress. She has no wheezes.  GI: Soft. Bowel sounds are normal. She exhibits no distension. There is no tenderness.  Musculoskeletal: Normal range of motion.  Neurological: She is alert and oriented to person, place, and time.  Skin: Skin is warm and dry.  Psychiatric: She has a normal mood and affect. Her behavior is normal.    Results for orders placed or performed during the hospital encounter of 07/17/15 (from the past 24 hour(s))  CBC with Differential/Platelet     Status: Abnormal   Collection Time: 07/17/15 11:21 AM  Result Value Ref Range   WBC 11.6 (H) 4.0 - 10.5 K/uL   RBC 3.44 (L) 3.87 - 5.11 MIL/uL   Hemoglobin 9.9 (L) 12.0 - 15.0 g/dL   HCT 45.4 (L) 09.8 - 11.9 %   MCV 87.5 78.0 - 100.0 fL   MCH 28.8 26.0 - 34.0 pg  MCHC 32.9 30.0 - 36.0 g/dL   RDW 13.013.4 86.511.5 - 78.415.5 %   Platelets 252 150 - 400 K/uL   Neutrophils Relative % 86 %   Neutro Abs 10.1 (H) 1.7 - 7.7 K/uL   Lymphocytes Relative 9 %   Lymphs Abs 1.1 0.7 - 4.0 K/uL   Monocytes Relative 1 %   Monocytes Absolute 0.2 0.1 - 1.0 K/uL   Eosinophils Relative 3 %   Eosinophils Absolute 0.3 0.0 - 0.7 K/uL   Basophils Relative 0 %   Basophils Absolute 0.0 0.0 - 0.1 K/uL    No results found. Recent flu, Tdap, Rhogam  Assessment/Plan: 34yo [id number] s/p VBAC 06/28/15, with mastitis Admit with vancomycin 1  g bid Lactation consultation Pain control - ibuprofen and percocet prn  Bovard-Stuckert, Shawn Dannenberg 07/17/2015, 2:18 PM

## 2015-07-17 NOTE — MAU Note (Signed)
Patient had baby on 06/28/15

## 2015-07-17 NOTE — MAU Note (Signed)
Called Lactation to see patient

## 2015-07-17 NOTE — Progress Notes (Signed)
P2, Mother presented to MAU with fever & chills and painful red breasts, especially R breast. Mother has implants put in "years ago".  Both areolas were removed at the time of surgery. Mother had started PO antibiotic treatment on Friday 5/19 for Mastitis. Baby was 5 lb 14 oz at birth and is now 2520 days old. At birth 7634w4d GA. Per FOB baby has had adequate weight gain. Parents state baby was doing well and sleeping 4-5 hours during the night between feedings. Mother has cracks on the tips of both nipples and is sore. Her R breast is full and hard.  L breast is soft. Mother has been using single electric breast pump. On Wed parents introduced nipple shield because of mother's sore nipples. Mother states since engorgement started it was difficult to get breasts to empty with pumping so she applied heat. Plan is for mother to keep breasts soft. Apply ice before feedings or pumping sessions.  Breastfeed on demand at least 8-12 x per day. Wake baby for feeding if needed. If breasts are hard after breastfeeding post  pump for 5-10 min to soften. Set up DEBP and had mother pump for 15 min.  Mother received 11 ml of breastmilk. Continue to apply ice packs to breast and pump after feedings if breasts are still firm, hard. Attempted latching baby without NS (pumping helped evert nipples) but mother stated it hurt too much. Applied personal NS and baby latched.  Sucks and some swallows observed, some w/ stimulation. Suggest parents feed baby on demand and set alarm for 3-3.5 hours so baby does not sleep too long without feeding.  Milk viewed in NS. Suggest follow up with LC with pre and post weight. Made OP appointment for Friday 9am 5/26 and gave parents paperwork for 2 week pump rental. FOB of baby will call insurance tomorrow to inquire about double electric breast pump.

## 2015-07-17 NOTE — MAU Provider Note (Signed)
Chief Complaint: Breast Pain   First Provider Initiated Contact with Patient 07/17/15 1129     SUBJECTIVE HPI: Cindy Hendricks is a 35 y.o. [id number] at 3 weeks postpartum who presents to Maternity Admissions reporting worsening of mastitis. Started dicloxacillin on 07/15/2015 for mastitis of left breast. That breast has improved, but the right breast is now extremely enlarged, painful, red and hot. Pt reports fevers as high as 103.1 last night. Taking ibuprofen 600 mg every 6. Unable to pump any milk from left breast and only 2 ounces from right breast. Has had problems with latch, possibly do to baby being small and has experienced significant nipple trauma, cracking and bleeding. Started using nipple shields a few days ago and nipples have healed significantly.  History of breast implants prior to birth of first child. Was able to breast-feed him successfully.  Location: Breasts, R>L Quality: Sore, tight Severity: 10/10 on pain scale Duration: 4 days Context: Breastfeeding Course: [place] Timing: constant Modifying factors: Improvement of left breast since starting ABX, but worsening of right breast since then. No improvement w/ breastfeeding or pumping. Associated signs and symptoms: Pos for fever, chills, body aches, fatigue, breast redness, decreased milk output.   Past Medical History  Diagnosis Date  . Medical history non-contributory    OB History  Gravida Para Term Preterm AB SAB TAB Ectopic Multiple Living  3 2 2  1   1  0 2    # Outcome Date GA Lbr Len/2nd Weight Sex Delivery Anes PTL Lv  3 Term 06/28/15 3468w4d 02:35 / 00:05 5 lb 14 oz (2.665 kg) F Vag-Spont None  Y  2 Term 03/26/12     CS-LTranv     1 Ectopic 1999             Past Surgical History  Procedure Laterality Date  . Breast surgery    . Cesarean section     Social History   Social History  . Marital Status: Married    Spouse Name: N/A  . Number of Children: N/A  . Years of Education: N/A    Occupational History  . Not on file.   Social History Main Topics  . Smoking status: Former Games developermoker  . Smokeless tobacco: Never Used  . Alcohol Use: No  . Drug Use: No  . Sexual Activity: Yes    Birth Control/ Protection: None   Other Topics Concern  . Not on file   Social History Narrative   No current facility-administered medications on file prior to encounter.   Current Outpatient Prescriptions on File Prior to Encounter  Medication Sig Dispense Refill  . acetaminophen (TYLENOL) 500 MG tablet Take 1,000 mg by mouth every 6 (six) hours as needed for mild pain or fever.     . ferrous sulfate 325 (65 FE) MG tablet Take 325 mg by mouth daily with breakfast.    . ibuprofen (ADVIL,MOTRIN) 600 MG tablet Take 1 tablet (600 mg total) by mouth every 6 (six) hours as needed for mild pain, moderate pain or cramping. 60 tablet 1  . Prenatal Vit-Fe Fumarate-FA (PRENATAL MULTIVITAMIN) TABS tablet Take 1 tablet by mouth daily at 12 noon.      No Known Allergies  I have reviewed the past Medical Hx, Surgical Hx, Social Hx, Allergies and Medications.   Review of Systems  Constitutional: Positive for fever, chills and fatigue.  Respiratory: Negative for cough.   Gastrointestinal: Negative for abdominal pain.  Genitourinary: Negative for dysuria.  Skin:  Pos for breast redness, masses, tenderness  Neurological: Positive for weakness.    OBJECTIVE Patient Vitals for the past 24 hrs:  BP Temp Pulse Resp  07/17/15 1121 131/81 mmHg 99.4 F (37.4 C) (!) 138 20   Constitutional: Well-developed, well-nourished female in mild distress. Tired-appearing.  Cardiovascular:Tachycardic Respiratory: normal rate and effort.  Breasts:   Left breast mildly tender, hot. 3 cm erythematous, non-fluctuant mass at 5 o'clock. Nipple cracked, leaking milk.   Right breast hot, severely enlarged and severely tender. #2 6 cm erythematous, non-fluctuant masses. Nipple cracked, leaking milk.  GI: Abd  soft, non-tender. Neurologic: Alert and oriented x 4.  GU: Deferred  LAB RESULTS Results for orders placed or performed during the hospital encounter of 07/17/15 (from the past 24 hour(s))  CBC with Differential/Platelet     Status: Abnormal   Collection Time: 07/17/15 11:21 AM  Result Value Ref Range   WBC 11.6 (H) 4.0 - 10.5 K/uL   RBC 3.44 (L) 3.87 - 5.11 MIL/uL   Hemoglobin 9.9 (L) 12.0 - 15.0 g/dL   HCT 16.1 (L) 09.6 - 04.5 %   MCV 87.5 78.0 - 100.0 fL   MCH 28.8 26.0 - 34.0 pg   MCHC 32.9 30.0 - 36.0 g/dL   RDW 40.9 81.1 - 91.4 %   Platelets 252 150 - 400 K/uL   Neutrophils Relative % 86 %   Neutro Abs 10.1 (H) 1.7 - 7.7 K/uL   Lymphocytes Relative 9 %   Lymphs Abs 1.1 0.7 - 4.0 K/uL   Monocytes Relative 1 %   Monocytes Absolute 0.2 0.1 - 1.0 K/uL   Eosinophils Relative 3 %   Eosinophils Absolute 0.3 0.0 - 0.7 K/uL   Basophils Relative 0 %   Basophils Absolute 0.0 0.0 - 0.1 K/uL    IMAGING No results found.  MAU COURSE CBC w/ dif. Ibuprofen.   Discussed Hx, exam w/ Dr. Ellyn Hack. Will admit for IV ABX.   MDM - 35 year-old female at 3 weeks postpartum w/ worsening mastitis after 48 hour of ABX, nipple trauma and severe engorgement. Will admit for IV ABX and lactation consultation.   ASSESSMENT 1. Postpartum mastitis     PLAN Admit to women's unit for vancomycin per pharmacy. Ibuprofen every 6 Percocet when necessary Lactation consultant  Dorathy Kinsman, CNM 07/17/2015  11:59 AM

## 2015-07-18 DIAGNOSIS — N61 Mastitis without abscess: Secondary | ICD-10-CM | POA: Diagnosis present

## 2015-07-18 LAB — CREATININE, SERUM: CREATININE: 0.54 mg/dL (ref 0.44–1.00)

## 2015-07-18 NOTE — Lactation Note (Signed)
Lactation Consultation Note  Patient Name: Cindy Hendricks ZHYQM'VToday's Date: 07/18/2015   Gave mom handouts on Fenugreek, Moringa, and More Milk Mother's Blend. Advised her to use one and not combine. Advised her to research and talk to her PCP before beginning.      Maternal Data    Feeding    [facility]                      Lactation Tools Discussed/Used     Consult Status      Ed BlalockSharon S Virdia Ziesmer 07/18/2015, 4:48 PM

## 2015-07-18 NOTE — Lactation Note (Addendum)
Lactation Consultation Note  Patient Name: Cindy Hendricks ZOXWR'UToday's Date: 07/18/2015   Follow up with mom being treated for mastitis to left breast. Left breast is soft and mom reports she is unable to pump milk out. Right breast if full and firm and reddened to outer aspect of breast. Mom reports she is using ice every 2 hours and trying to pump. She reports she is also using heat after ice, enc her to use ice for now until engorgement resolved. She is breastfeeding infant with the use of a NS she brought to hospital. Right nipple with large excoriated area across nipple from 11 o'clock  to 5 o'clock, appears to be positional stripe and is red in appearance, no active drainage noted. Left nipple with faint positional stripe and without drainage.   Mom with breast implants and noted she has incisional scars under breasts and around entirety of areola area. She reports that she BF her first child for a few weeks and then stopped as the infant was "always hungry". She reports that this infant is also hungry after BF. Discussed with mom that ducts and nerves could have been affected with her surgeries and that the milk may be trapped in the breast contributing to the engorgement and mastitis due to nerve damage and/or ductal damage. She is giving formula post BF and is aware that infant weight needs to be followed closely for growth.  Plan to follow up for next feeding and perform pre-post weights to determine milk transfer. Mom agreeable to plan. Infant fed at 10 am.      Maternal Data    Feeding    LATCH Score/Interventions                      Lactation Tools Discussed/Used     Consult Status      Ed BlalockSharon S Treva Huyett 07/18/2015, 11:36 AM

## 2015-07-18 NOTE — Progress Notes (Addendum)
Patient ID: Cindy BallsLaura Pereira Hendricks, female   DOB: 23-Feb-1981, 35 y.o.   MRN: [id number]  HD #2 Mastitis  No c/o's.  Feeling better  Tm = 102.7, Tc =98.3  VSS gen NAD Br enlarged, less swollen, still somewhat engorged  Receiving Vancomycin, when time for d/c; d/c with clindamycin Appreciate lactation consultant services

## 2015-07-18 NOTE — Progress Notes (Signed)
Initial visit with Vernona RiegerLaura and her mother and daughter to introduce spiritual care services and offer support since her readmission to the hospital due to mastitis.  Vernona RiegerLaura shared that coming back to the hospital was upsetting, but she is coping well and glad for the support of her family.  She is grateful to have the baby with her in her room.  Please page as further needs arise.  Maryanna ShapeAmanda M. Carley Hammedavee Lomax, M.Div. [facility] Chaplain Pager [phone] Office [phone]

## 2015-07-18 NOTE — Progress Notes (Signed)
Serum Creatinine drawn today was 0.54mg /dL.  Plan to continue current Vancomycin regimen and will check trough around noon dose on 5/23 if IV abx continues.  Hurley CiscoMendenhall, Ledonna Dormer D, Pharm.D.

## 2015-07-18 NOTE — Lactation Note (Addendum)
Lactation Consultation Note  Patient Name: Cindy BallsLaura Pereira Conde GNFAO'ZToday's Date: 07/18/2015   Follow up with mom for infant feeding. Mom pumped within the last 30 minutes and pumped 4 cc out of each breast. Right breast remains firm and reddened to outer aspect. Both nipples excoriated with right worse than left. Mom denies engorgement prior to this episode.   Infant was placed to right breast, mom placed her NS. Mom did not want to feed without the NS. Infant latched easily with rhythmic suckling and a few intermittent swallows. Mom massaged breast with feeding. Mom denied pain with feeding to nipple. Infant fed for 15 minutes and then was getting fussy. Mom was independent in latching infant to breast. Her goal is to BF for as long as she can. Offered mom SNS with advantages to nipple stimulation and getting supplement while at breast reviewed, Mom declined saying she would prefer to keep feeding with the bottle.  Mom reports infant is feeding 4 oz/feeding. She has a void before feeding.   Infant weight today: 3202 grams- 7 lb 0.9 oz  Birthweight was 5 lb 15 oz per parents. Pre feed weight- 3218 grams with diaper Post 15 minute feed weight-3222 grams with diaper Milk transfer 4 cc  Discussed with family that infant must be supplemented post BF.  Plan: Ice left breast for 20 minutes every 2-3 hours before feeding Breastfeed infant 8-12 x in 24 hours at first feeding cues, use NS as needed. Supplement infant with EBM/ Formula to satisfy infant hunger Pump for 15 minutes post BF Call for assistance as needed.  Follow up tomorrow and prn  Dad and mom reports that mom had breast implants about 12 years ago with incisions under breasts, implants were in front of chest wall. Breast implants were causing mom problems so they were removed and replaced 9 years ago behind the chest wall, areola was completely removed with the second surgery.        Maternal Data    Feeding    LATCH  Score/Interventions                      Lactation Tools Discussed/Used     Consult Status      Ed BlalockSharon S Teddy Rebstock 07/18/2015, 1:41 PM

## 2015-07-19 MED ORDER — IBUPROFEN 800 MG PO TABS: 800.0000 mg | ORAL_TABLET | Freq: Three times a day (TID) | ORAL | Status: AC | PRN

## 2015-07-19 MED ORDER — OXYCODONE-ACETAMINOPHEN 5-325 MG PO TABS: 1.0000 | ORAL_TABLET | Freq: Four times a day (QID) | ORAL | Status: AC | PRN

## 2015-07-19 MED ORDER — CLINDAMYCIN HCL 300 MG PO CAPS: 300.0000 mg | ORAL_CAPSULE | Freq: Three times a day (TID) | ORAL | Status: DC

## 2015-07-19 MED ORDER — PRENATAL MULTIVITAMIN CH: 1.0000 | ORAL_TABLET | Freq: Every day | ORAL | Status: AC

## 2015-07-19 NOTE — Progress Notes (Addendum)
Patient ID: Cindy Hendricks, female   DOB: 1980-10-09, 35 y.o.   MRN: [id number]  HD#3 mastitis  Feeling better, breast still tight  AFVSS gen NAD Br R erythema, engorged.  L NM, NT  Continue current mgmt Upon discharge, send with clindamycin d/c this PM - will d/w lactation and d/c after their eval.  Had pre and post weights, only transferred 4 cc.

## 2015-07-19 NOTE — Lactation Note (Signed)
Lactation Consultation Note  Patient Name: Cindy Hendricks ZOXWR'UToday's Date: 07/19/2015   Spoke with Dr. Ellyn HackBovard this morning via phone in regards to mastitis. Dr. Ellyn HackBovard requested I see this mom as I saw her yesterday to determine if appearance of the breast has improved.   In to see mom. Mom declined interpreter. Dad is at bedside. Mom in bed feeding infant a bottle of formula. She is using ice to breasts and has been pumping and massaging breasts. Mom reports she is getting about 4-5 cc EBM with pumping. She reports her breast feels better, The breast is less reddened and and less edematous today as compared to yesterday.  Mom reports that she was able to pump 3-4 oz previously (over a week ago), Dad and mom reports she decreased BF once nipples became sore and that is when her milk supply began to diminish. She then added a NS to assist with sore nipples. Mom has been pumping as asked since being in hospital with no increase in volume pumped. Mom reports she wants to stop BF as she cannot tolerate pain any longer.   Advised mom to lengthen time between each pumping over the next 3-4 days and then to stop completely, apply cabbage and ice every 2 hours or more often to allow milk to dry up fully, wear snug bra at all times. Continue Antibiotics as prescribed by OB. Mentioned APNO to mom to help treat nipples. Right nipple less pink today and has a large positional striped abrasion noted, no drainage seen. Left nipple with scabbed area without drainage.    Spoke with Dr. Ellyn HackBovard via phone with findings and she will [facility]. Mom requested I cancel her LC appt scheduled for Friday as she says infant has to go for weight check. She did not want to reschedule. She has LC phone # to call prn.     Maternal Data    Feeding    LATCH Score/Interventions                      Lactation Tools Discussed/Used     Consult Status      Ed BlalockSharon S Lamondre Wesche 07/19/2015,  10:58 AM

## 2015-07-19 NOTE — Progress Notes (Signed)
Pt d/c'd home with written instructions. Pt verbalized an understanding of instructions. Pt has all belongings. No concerns noted at this time. Carmelina DaneERRI L Elridge Stemm, RN

## 2015-07-19 NOTE — Discharge Summary (Signed)
    OB Discharge Summary     Patient Name: Cindy Hendricks DOB: 04-22-1980 MRN: [id number]  Date of admission: 07/17/2015 Delivering MD: This patient has no babies on file.  Date of discharge: 07/19/2015  Admitting diagnosis: PP BREAST PAIN Intrauterine pregnancy: Unknown     Secondary diagnosis:  Active Problems:   Mastitis, associated with childbirth   Mastitis in female  Additional problems: N/A     Discharge diagnosis: Mastitis                                                                                                Post partum procedures:mastitis - IV abx, lactation consult  Augmentation: N/A  Complications: None  Hospital course:  Admitted with severe mastitis unresponsive to oral antibiotics, given IV abx and lactation support  Physical exam  Filed Vitals:   07/18/15 1501 07/18/15 1712 07/18/15 2200 07/19/15 0600  BP: 95/61 97/63 100/63 93/57  Pulse: 92 95 100 70  Temp: 98.6 F (37 C) 97.7 F (36.5 C) 98 F (36.7 C) 97.9 F (36.6 C)  TempSrc: Oral Oral Oral Oral  Resp: 18 18 20 18   Height:      Weight:      SpO2: 100% 100% 100% 99%   General: alert and no distress Br: R Br - firm,swollen NT, L NM, NT Labs: Lab Results  Component Value Date   WBC 11.6* 07/17/2015   HGB 9.9* 07/17/2015   HCT 30.1* 07/17/2015   MCV 87.5 07/17/2015   PLT 252 07/17/2015   CMP Latest Ref Rng 07/18/2015  Creatinine 0.44 - 1.00 mg/dL 0.100.54    Discharge instruction: per After Visit Summary and "Baby and Me Booklet".  After visit meds:    Medication List    ASK your doctor about these medications        acetaminophen 500 MG tablet  Commonly known as:  TYLENOL  Take 1,000 mg by mouth every 6 (six) hours as needed for mild pain or fever.        ferrous sulfate 325 (65 FE) MG tablet  Take 325 mg by mouth daily with breakfast.     ibuprofen 600 MG tablet  Commonly known as:  ADVIL,MOTRIN  Take 1 tablet (600 mg total) by mouth every 6 (six) hours as needed  for mild pain, moderate pain or cramping.     prenatal multivitamin Tabs tablet  Take 1 tablet by mouth daily at 12 noon.        Diet: routine diet  Activity: Advance as tolerated. Pelvic rest for 6 weeks.   Outpatient follow up:2-4 weeks - post partum care Follow up Appt:Future Appointments Date Time Provider Department Center  07/22/2015 9:00 AM [facility] LAC CONSULTANT [facility] None   Follow up Visit:No Follow-up on file.   07/19/2015 Sherian ReinBovard-Stuckert, Khala Tarte, MD

## 2015-07-19 NOTE — Progress Notes (Signed)
Pt appears in semi fowlers with ice applied to right breast. Pt rates pain 3/10. Dr. Ellyn HackBovard in to speak with patient. Lactation will be in to discuss the continuation of breastfeeding. Interpreter required.

## 2015-07-22 ENCOUNTER — Ambulatory Visit (HOSPITAL_COMMUNITY): Admit: 2015-07-22 | Payer: BLUE CROSS/BLUE SHIELD

## 2015-07-28 ENCOUNTER — Inpatient Hospital Stay (HOSPITAL_COMMUNITY)
Admission: AD | Admit: 2015-07-28 | Discharge: 2015-07-28 | Disposition: A | Discharge status: Home / Self Care | Payer: BLUE CROSS/BLUE SHIELD | Source: Ambulatory Visit | Attending: Obstetrics and Gynecology | Admitting: Obstetrics and Gynecology

## 2015-07-28 DIAGNOSIS — T7840XA Allergy, unspecified, initial encounter: Secondary | ICD-10-CM

## 2015-07-28 DIAGNOSIS — R21 Rash and other nonspecific skin eruption: Secondary | ICD-10-CM | POA: Insufficient documentation

## 2015-07-28 DIAGNOSIS — T368X5A Adverse effect of other systemic antibiotics, initial encounter: Secondary | ICD-10-CM | POA: Diagnosis not present

## 2015-07-28 DIAGNOSIS — Z87891 Personal history of nicotine dependence: Secondary | ICD-10-CM | POA: Diagnosis not present

## 2015-07-28 MED ORDER — METHYLPREDNISOLONE 4 MG PO TBPK: ORAL_TABLET | ORAL | Status: AC

## 2015-07-28 NOTE — MAU Provider Note (Signed)
History     CSN: [id number]  Arrival date and time: 07/28/15 2008   First Provider Initiated Contact with Patient 07/28/15 2025      No chief complaint on file.  HPI pt is 4 weeks post partum VBAC 5/2/107 d/c home on 06/30/2015 and readmitted on 07/17/2015 with mastitis and IV antibiotics- discharged home on 07/19/2015 to continue oral antibiotics(clindamycin) which she finished yesterday- pt's mastitis is resolved.  Yesterday pt started having skin rash with itching.  Today the rash is worse, all over her body- macular reddened diffuse over neck, chest, breasts, abdomen, back and buttocks and upper legs.  Pt took a Benadryl at 12 noon today and another at 4 pm. Pt has weaned the baby who is on formula now. Pt has no other complaints. Rn Note: RN Social research officer, government)     Expand All Collapse All   Pt reports she finished clindamycin yesterday and started breaking out yesterday.     Pt took Benadryl at 12 noon and 4 pm.   Past Medical History  Diagnosis Date  . Medical history non-contributory     Past Surgical History  Procedure Laterality Date  . Breast surgery    . Cesarean section      No family history on file.  Social History  Substance Use Topics  . Smoking status: Former Games developer  . Smokeless tobacco: Never Used  . Alcohol Use: No    Allergies: No Known Allergies  Prescriptions prior to admission  Medication Sig Dispense Refill Last Dose  . acetaminophen (TYLENOL) 500 MG tablet Take 1,000 mg by mouth every 6 (six) hours as needed for mild pain or fever.    07/16/2015 at Unknown time  . clindamycin (CLEOCIN) 300 MG capsule Take 1 capsule (300 mg total) by mouth 3 (three) times daily. 36 capsule 0   . ferrous sulfate 325 (65 FE) MG tablet Take 325 mg by mouth daily with breakfast.   07/17/2015 at Unknown time  . ibuprofen (ADVIL,MOTRIN) 800 MG tablet Take 1 tablet (800 mg total) by mouth every 8 (eight) hours as needed for moderate pain. 45 tablet 1   .  oxyCODONE-acetaminophen (PERCOCET/ROXICET) 5-325 MG tablet Take 1-2 tablets by mouth every 6 (six) hours as needed for severe pain. 15 tablet 0   . Prenatal Vit-Fe Fumarate-FA (PRENATAL MULTIVITAMIN) TABS tablet Take 1 tablet by mouth daily at 12 noon. 100 tablet 2     Review of Systems  Constitutional: Negative for fever and chills.       Diffuse macular reddened rash over neck, chest, breasts, abdomen, buttocks and upper legs.  Cardiovascular:       Breasts soft without any reddness or tenderness  Gastrointestinal: Negative for nausea, vomiting and abdominal pain.  Skin: Positive for itching and rash.   Physical Exam   Blood pressure 121/80, pulse 93, temperature 98.2 F (36.8 C), temperature source Oral, resp. rate 16, height  (1.549 m), weight 116 lb (52.617 kg), SpO2 100 %, unknown if currently breastfeeding.  Physical Exam  Nursing note and vitals reviewed. Constitutional: She is oriented to person, place, and time. She appears well-developed and well-nourished. No distress.  HENT:  Head: Normocephalic.  Eyes: Pupils are equal, round, and reactive to light.  Neck: Normal range of motion. Neck supple.  Cardiovascular: Normal rate.   Respiratory: Effort normal. No respiratory distress. She has no wheezes.  GI: Soft. She exhibits no distension.  Musculoskeletal: Normal range of motion.  Neurological: She is alert and oriented to  person, place, and time.  Skin: Skin is warm and dry. Rash noted. Rash is macular. There is erythema.    MAU Course  Procedures  Discussed with pharmacist- notation of allergic reaction in chart Discussed with Dr. Mindi SlickerBanga  Assessment and Plan  Allergic reaction to clindamycin Rx Medrol dose pack#21 May take Benadryl F/u with Dr. Geronimo BootBanga  Cindy Hendricks 07/28/2015, 8:28 PM

## 2015-07-28 NOTE — MAU Note (Signed)
Pt reports she finished clindamycin yesterday and started breaking out yesterday.

## 2015-07-28 NOTE — Discharge Instructions (Signed)

## 2018-10-07 DIAGNOSIS — G479 Sleep disorder, unspecified: Secondary | ICD-10-CM | POA: Insufficient documentation

## 2018-10-07 DIAGNOSIS — G44209 Tension-type headache, unspecified, not intractable: Secondary | ICD-10-CM | POA: Insufficient documentation

## 2018-10-07 DIAGNOSIS — R5383 Other fatigue: Secondary | ICD-10-CM | POA: Insufficient documentation

## 2018-12-28 DIAGNOSIS — N3020 Other chronic cystitis without hematuria: Secondary | ICD-10-CM | POA: Insufficient documentation

## 2018-12-28 DIAGNOSIS — R300 Dysuria: Secondary | ICD-10-CM | POA: Insufficient documentation

## 2019-10-25 DIAGNOSIS — M41125 Adolescent idiopathic scoliosis, thoracolumbar region: Secondary | ICD-10-CM | POA: Insufficient documentation

## 2019-11-23 ENCOUNTER — Other Ambulatory Visit: Payer: BLUE CROSS/BLUE SHIELD

## 2019-11-23 DIAGNOSIS — Z20822 Contact with and (suspected) exposure to covid-19: Secondary | ICD-10-CM

## 2019-11-25 LAB — NOVEL CORONAVIRUS, NAA: SARS-CoV-2, NAA: NOT DETECTED

## 2019-11-25 LAB — SARS-COV-2, NAA 2 DAY TAT

## 2020-04-04 ENCOUNTER — Ambulatory Visit (INDEPENDENT_AMBULATORY_CARE_PROVIDER_SITE_OTHER): Payer: 59 | Admitting: Podiatry

## 2020-04-04 ENCOUNTER — Other Ambulatory Visit: Payer: Self-pay

## 2020-04-04 DIAGNOSIS — L600 Ingrowing nail: Secondary | ICD-10-CM

## 2020-04-04 DIAGNOSIS — O3429 Maternal care due to uterine scar from other previous surgery: Secondary | ICD-10-CM | POA: Insufficient documentation

## 2020-04-04 MED ORDER — GENTAMICIN SULFATE 0.1 % EX CREA: 1.0000 "application " | TOPICAL_CREAM | Freq: Two times a day (BID) | CUTANEOUS | 1 refills | Status: AC

## 2020-04-04 MED ORDER — DOXYCYCLINE HYCLATE 100 MG PO TABS: 100.0000 mg | ORAL_TABLET | Freq: Two times a day (BID) | ORAL | 0 refills | Status: DC

## 2020-04-04 NOTE — Progress Notes (Signed)
   Subjective: Patient presents today for evaluation of pain to the lateral border of the left great toe. Patient is concerned for possible ingrown nail. Patient presents today for further treatment and evaluation.  Past Medical History:  Diagnosis Date  . Medical history non-contributory     Objective:  General: Well developed, nourished, in no acute distress, alert and oriented x3   Dermatology: Skin is warm, dry and supple bilateral.  Lateral border left great toe appears to be erythematous with evidence of an ingrowing nail. Pain on palpation noted to the border of the nail fold. The remaining nails appear unremarkable at this time. There are no open sores, lesions.  Vascular: Dorsalis Pedis artery and Posterior Tibial artery pedal pulses palpable. No lower extremity edema noted.   Neruologic: Grossly intact via light touch bilateral.  Musculoskeletal: Muscular strength within normal limits in all groups bilateral. Normal range of motion noted to all pedal and ankle joints.   Assesement: #1 Paronychia with ingrowing nail lateral border left great #2 Pain in toe  Plan of Care:  1. Patient evaluated.  2. Discussed treatment alternatives and plan of care. Explained nail avulsion procedure and post procedure course to patient. 3. Patient opted for permanent partial nail avulsion of the lateral border left great toe.  4. Prior to procedure, local anesthesia infiltration utilized using 3 ml of a 50:50 mixture of 2% plain lidocaine and 0.5% plain marcaine in a normal hallux block fashion and a betadine prep performed.  5. Partial permanent nail avulsion with chemical matrixectomy performed using 3x30sec applications of phenol followed by alcohol flush.  6. Light dressing applied. 7.  Prescription for doxycycline 100 mg 2 times daily #20  8.  Prescription for gentamicin cream  9.  Return to clinic 2 weeks.  *From [place]  Emmani Lesueur M. Jousha Schwandt, DPM [facility]  Dr. Felecia Shelling, DPM    [address] [place], [place] [address]                Office [phone]  Fax [phone]

## 2020-04-18 ENCOUNTER — Other Ambulatory Visit: Payer: Self-pay

## 2020-04-18 ENCOUNTER — Ambulatory Visit (INDEPENDENT_AMBULATORY_CARE_PROVIDER_SITE_OTHER): Payer: 59 | Admitting: Podiatry

## 2020-04-18 DIAGNOSIS — L600 Ingrowing nail: Secondary | ICD-10-CM

## 2020-04-18 NOTE — Progress Notes (Signed)
   Subjective: 40 y.o. female presents today status post permanent nail avulsion procedure of the lateral border of the left great toe that was performed on 04/04/2020.  Patient states that she is feeling well.  She took the oral antibiotic as prescribed.  She is also been soaking her feet and applying the antibiotic cream.  No new complaints at this time  Past Medical History:  Diagnosis Date  . Medical history non-contributory     Objective: Skin is warm, dry and supple. Nail and respective nail fold appears to be healing appropriately. Open wound to the associated nail fold with a granular wound base and moderate amount of fibrotic tissue. Minimal drainage noted. Mild erythema around the periungual region likely due to phenol chemical matricectomy.  Assessment: #1 postop permanent partial nail avulsion lateral border left great toe #2 open wound periungual nail fold of respective digit.   Plan of care: #1 patient was evaluated  #2 debridement of open wound was performed to the periungual border of the respective toe using a currette. Antibiotic ointment and Band-Aid was applied. #3 patient is to return to clinic on a PRN basis.  *From [place]   Brent M. Evans, DPM [facility]  Dr. Felecia Shelling, DPM    [address] [place], [place] [address]                Office [phone]  Fax [phone]

## 2020-06-01 ENCOUNTER — Other Ambulatory Visit: Payer: Self-pay

## 2020-06-01 ENCOUNTER — Ambulatory Visit (INDEPENDENT_AMBULATORY_CARE_PROVIDER_SITE_OTHER): Payer: 59 | Admitting: Podiatry

## 2020-06-01 DIAGNOSIS — L600 Ingrowing nail: Secondary | ICD-10-CM | POA: Diagnosis not present

## 2020-06-08 NOTE — Progress Notes (Signed)
   Subjective: Patient presents today for evaluation of pain to the lateral border of the left great toe.  Patient has a history of nail matricectomy to the lateral border of the left great toe on 04/04/2020.  She states that she began to notice increased pain and tenderness with drainage to the area.  She presents for further treatment and evaluation  Past Medical History:  Diagnosis Date  . Medical history non-contributory     Objective:  General: Well developed, nourished, in no acute distress, alert and oriented x3   Dermatology: Skin is warm, dry and supple bilateral.  Lateral border left great toe appears to be erythematous with evidence of an ingrowing nail. Pain on palpation noted to the border of the nail fold. The remaining nails appear unremarkable at this time. There are no open sores, lesions.  Vascular: Dorsalis Pedis artery and Posterior Tibial artery pedal pulses palpable. No lower extremity edema noted.   Neruologic: Grossly intact via light touch bilateral.  Musculoskeletal: Muscular strength within normal limits in all groups bilateral. Normal range of motion noted to all pedal and ankle joints.   Assesement: #1  Recurrent paronychia with ingrowing nail lateral border left great #2 Pain in toe  Plan of Care:  1. Patient evaluated.  2. Discussed treatment alternatives and plan of care. Explained nail avulsion procedure and post procedure course to patient. 3. Patient opted for permanent partial nail avulsion of the lateral border left great toe.  4. Prior to procedure, local anesthesia infiltration utilized using 3 ml of a 50:50 mixture of 2% plain lidocaine and 0.5% plain marcaine in a normal hallux block fashion and a betadine prep performed.  5. Partial permanent nail avulsion with chemical matrixectomy performed using 3x30sec applications of phenol followed by alcohol flush.  6. Light dressing applied. 7.  Silvadene cream provided for the patient to apply daily 8.   Return to clinic in 2 weeks  *From [place]  Ceonna Frazzini M. Gwenivere Hiraldo, DPM [facility]  Dr. Felecia Shelling, DPM    [address] [place], [place] [address]                Office [phone]  Fax [phone]

## 2020-06-15 ENCOUNTER — Other Ambulatory Visit: Payer: Self-pay

## 2020-06-15 ENCOUNTER — Encounter: Payer: Self-pay | Admitting: Podiatry

## 2020-06-15 ENCOUNTER — Ambulatory Visit (INDEPENDENT_AMBULATORY_CARE_PROVIDER_SITE_OTHER): Payer: 59 | Admitting: Podiatry

## 2020-06-15 DIAGNOSIS — L600 Ingrowing nail: Secondary | ICD-10-CM | POA: Diagnosis not present

## 2020-06-15 NOTE — Progress Notes (Signed)
   Subjective: 40 y.o. female presents today status post permanent nail avulsion procedure of the lateral border of the left great toe that was performed on 06/01/2020.  Patient states that she is feeling well. She is also been soaking her feet and applying the antibiotic cream.  No new complaints at this time  Past Medical History:  Diagnosis Date  . Medical history non-contributory     Objective: Skin is warm, dry and supple. Nail and respective nail fold appears to be healing appropriately. Open wound to the associated nail fold with a granular wound base and moderate amount of fibrotic tissue. Minimal drainage noted. Mild erythema around the periungual region likely due to phenol chemical matricectomy.  Assessment: #1 postop permanent partial nail avulsion lateral border left great toe #2 open wound periungual nail fold of respective digit.   Plan of care: #1 patient was evaluated  #2 debridement of open wound was performed to the periungual border of the respective toe using a currette. Antibiotic ointment and Band-Aid was applied. #3 patient is to return to clinic on a PRN basis.  *From [place]   Harjot Zavadil M. Imonie Tuch, DPM [facility]  Dr. Felecia Shelling, DPM    [address] [place], [place] [address]                Office [phone]  Fax [phone]

## 2020-11-14 ENCOUNTER — Other Ambulatory Visit: Payer: Self-pay

## 2020-11-14 ENCOUNTER — Emergency Department (HOSPITAL_BASED_OUTPATIENT_CLINIC_OR_DEPARTMENT_OTHER)
Admission: EM | Admit: 2020-11-14 | Discharge: 2020-11-14 | Disposition: A | Discharge status: Home / Self Care | Payer: 59 | Attending: Emergency Medicine | Admitting: Emergency Medicine

## 2020-11-14 ENCOUNTER — Encounter (HOSPITAL_BASED_OUTPATIENT_CLINIC_OR_DEPARTMENT_OTHER): Payer: Self-pay | Admitting: Emergency Medicine

## 2020-11-14 ENCOUNTER — Emergency Department (HOSPITAL_BASED_OUTPATIENT_CLINIC_OR_DEPARTMENT_OTHER): Payer: 59 | Admitting: Radiology

## 2020-11-14 DIAGNOSIS — S6991XA Unspecified injury of right wrist, hand and finger(s), initial encounter: Secondary | ICD-10-CM | POA: Diagnosis present

## 2020-11-14 DIAGNOSIS — M79644 Pain in right finger(s): Secondary | ICD-10-CM | POA: Diagnosis not present

## 2020-11-14 DIAGNOSIS — Z87891 Personal history of nicotine dependence: Secondary | ICD-10-CM | POA: Insufficient documentation

## 2020-11-14 DIAGNOSIS — W231XXA Caught, crushed, jammed, or pinched between stationary objects, initial encounter: Secondary | ICD-10-CM | POA: Insufficient documentation

## 2020-11-14 DIAGNOSIS — S63632A Sprain of interphalangeal joint of right middle finger, initial encounter: Secondary | ICD-10-CM | POA: Insufficient documentation

## 2020-11-14 MED ORDER — IBUPROFEN 400 MG PO TABS: 600.0000 mg | ORAL_TABLET | Freq: Once | ORAL | Status: DC

## 2020-11-14 NOTE — ED Triage Notes (Signed)
Pt arrives to ED with c/o of swollen and painful middle finger of right hand after hitting it on top of a wooden table.

## 2020-11-14 NOTE — ED Notes (Signed)
Patient verbalizes understanding of discharge instructions. Opportunity for questioning and answers were provided. Patient discharged from ED.  °

## 2020-11-14 NOTE — ED Provider Notes (Signed)
[facility] EMERGENCY DEPT Provider Note   CSN: [id number] Arrival date & time: 11/14/20  0847     History Chief Complaint  Patient presents with   Finger Injury    Cindy Hendricks is a 40 y.o. female.  HPI 39 year old female presents with right middle finger injury.  She states that she injured it a couple years ago and has been waking up with stiffness and pain ever since.  However yesterday her hand accidentally got slammed onto a wooden table with her middle 3 fingers hitting the wood.  She has been having pain and this morning woke up with her PIP swollen.  Some decrease sensation in the finger.  It is hard to fully range it.  Past Medical History:  Diagnosis Date   Medical history non-contributory     Patient Active Problem List   Diagnosis Date Noted   Uterine scar from previous surgery affecting pregnancy 04/04/2020   Adolescent idiopathic scoliosis of thoracolumbar region 10/25/2019   Chronic cystitis 12/28/2018   Dysuria 12/28/2018   Fatigue 10/07/2018   Sleep difficulties 10/07/2018   Tension-type headache, not intractable 10/07/2018   Mastitis in female 07/18/2015   Mastitis, associated with childbirth 07/17/2015   Maternal care for scar from previous cesarean delivery 06/28/2015   VBAC, delivered, current hospitalization 06/28/2015    Past Surgical History:  Procedure Laterality Date   BREAST SURGERY     CESAREAN SECTION       OB History     Gravida  3   Para  2   Term  2   Preterm      AB  1   Living  2      SAB      IAB      Ectopic  1   Multiple  0   Live Births  1           History reviewed. No pertinent family history.  Social History   Tobacco Use   Smoking status: Former   Smokeless tobacco: Never  Substance Use Topics   Alcohol use: No   Drug use: No    Home Medications Prior to Admission medications   Medication Sig Start Date End Date Taking? Authorizing Provider  acetaminophen  (TYLENOL) 500 MG tablet Take 1,000 mg by mouth every 6 (six) hours as needed for mild pain or fever.     [provider]  ascorbic acid (VITAMIN C) 250 MG tablet Take by mouth.    [provider]  Biotin 1000 MCG tablet Take by mouth.    [provider]  cyanocobalamin 100 MCG tablet Take by mouth.    [provider]  dicloxacillin (DYNAPEN) 500 MG capsule TK 1 C PO Q 8 H FOR 10 DAYS 07/15/15   [provider]  doxycycline (VIBRA-TABS) 100 MG tablet Take 1 tablet (100 mg total) by mouth 2 (two) times daily. 04/04/20   Felecia Shelling, DPM  ferrous sulfate 325 (65 FE) MG tablet Take 325 mg by mouth daily with breakfast.    [provider]  gentamicin cream (GARAMYCIN) 0.1 % Apply 1 application topically 2 (two) times daily. 04/04/20   Felecia Shelling, DPM  ibuprofen (ADVIL,MOTRIN) 800 MG tablet Take 1 tablet (800 mg total) by mouth every 8 (eight) hours as needed for moderate pain. 07/19/15   Bovard-Stuckert, Augusto Gamble, MD  levonorgestrel-ethinyl estradiol (ALESSE) 0.1-20 MG-MCG tablet Sronyx 0.1 mg-20 mcg tablet  TAKE 1 TABLET BY MOUTH EVERY DAY  [provider]  methylPREDNISolone (MEDROL DOSEPAK) 4 MG TBPK tablet Take as directed 07/28/15   Jean Rosenthal, NP  Multiple Vitamin (THERA) TABS Take 1 tablet by mouth daily.    [provider]  norethindrone (MICRONOR) 0.35 MG tablet Take 1 tablet by mouth daily.    [provider]  ondansetron (ZOFRAN-ODT) 8 MG disintegrating tablet TAKE 1 TABLET EVERY 8 HOURS AS NEEDED FOR NAUSEA AND VOMITING 06/23/15   [provider]  oxyCODONE-acetaminophen (PERCOCET/ROXICET) 5-325 MG tablet Take 1-2 tablets by mouth every 6 (six) hours as needed for severe pain. 07/19/15   Bovard-Stuckert, Augusto Gamble, MD  Prenatal Vit-Fe Fumarate-FA (PRENATAL MULTIVITAMIN) TABS tablet Take 1 tablet by mouth daily at 12 noon. 07/19/15   Bovard-Stuckert, Augusto Gamble, MD  tiZANidine (ZANAFLEX) 2 MG tablet Take by mouth.  10/23/19   [provider]  zinc gluconate 50 MG tablet Take by mouth.    [provider]    Allergies    Clindamycin/lincomycin  Review of Systems   Review of Systems  Musculoskeletal:  Positive for arthralgias and joint swelling.  Neurological:  Positive for numbness.   Physical Exam Updated Vital Signs BP 128/89 (BP Location: Right Arm)   Pulse 84   Temp 98.4 F (36.9 C) (Oral)   Resp 16   Ht 5\' 2"  (1.575 m)   Wt 51.7 kg   SpO2 100%   BMI 20.85 kg/m   Physical Exam Vitals and nursing note reviewed.  Constitutional:      Appearance: She is well-developed.  HENT:     Head: Normocephalic and atraumatic.     Right Ear: External ear normal.     Left Ear: External ear normal.     Nose: Nose normal.  Eyes:     General:        Right eye: No discharge.        Left eye: No discharge.  Cardiovascular:     Rate and Rhythm: Normal rate and regular rhythm.     Pulses:          Radial pulses are 2+ on the right side.  Pulmonary:     Effort: Pulmonary effort is normal.     Breath sounds: Normal breath sounds.  Abdominal:     Palpations: Abdomen is soft.     Tenderness: There is no abdominal tenderness.  Musculoskeletal:     Right wrist: No tenderness.     Right hand: Swelling present. No deformity. Decreased range of motion.     Comments: Tenderness and swelling to the right dorsal middle finger PIP.  She is able to both flex and extend the finger though it is painful.  She reports a decrease sensation but is able to feel.  Normal color/warmth.  Skin:    General: Skin is warm and dry.  Neurological:     Mental Status: She is alert.  Psychiatric:        Mood and Affect: Mood is not anxious.    ED Results / Procedures / Treatments   Labs (all labs ordered are listed, but only abnormal results are displayed) Labs Reviewed - No data to display  EKG None  Radiology DG Hand 2 View Right  Result Date: 11/14/2020 CLINICAL DATA:  Middle finger  swelling and pain. Genralzied hand pain. EXAM: RIGHT HAND - 2 VIEW COMPARISON:  None. FINDINGS: Normal alignment. No acute fracture. Normal mineralization. Minimal soft tissue swelling at the base of the third digit. IMPRESSION: Minimal soft tissue swelling of the  third digit without underlying acute osseous abnormality. Electronically Signed   By: Olive Bass M.D.   On: 11/14/2020 09:21    Procedures Procedures   Medications Ordered in ED Medications  ibuprofen (ADVIL) tablet 600 mg (has no administration in time range)    ED Course  I have reviewed the triage vital signs and the nursing notes.  Pertinent labs & imaging results that were available during my care of the patient were reviewed by me and considered in my medical decision making (see chart for details).    MDM Rules/Calculators/A&P                           Presentation is consistent with a finger sprain.  Likely a jammed finger by the way she describes it.  NSAIDs, Tylenol, ice and we will put in a splint.  She states she has a PCP and needs a follow-up with them in a week or so if not improving. Final Clinical Impression(s) / ED Diagnoses Final diagnoses:  Sprain of interphalangeal joint of right middle finger, initial encounter    Rx / DC Orders ED Discharge Orders     None        Pricilla Loveless, MD 11/14/20 1024

## 2021-05-10 ENCOUNTER — Other Ambulatory Visit: Payer: Self-pay

## 2021-05-10 ENCOUNTER — Ambulatory Visit: Payer: Managed Care, Other (non HMO) | Admitting: Podiatry

## 2021-05-10 DIAGNOSIS — L600 Ingrowing nail: Secondary | ICD-10-CM

## 2021-05-10 DIAGNOSIS — R52 Pain, unspecified: Secondary | ICD-10-CM

## 2021-05-10 NOTE — Progress Notes (Signed)
? ?  Subjective: ?Patient presents today for evaluation of pain to the lateral border right great toe. Patient is concerned for possible ingrown nail.  It is very sensitive to touch.  Patient has a history of partial nail matricectomy to the lateral border of the left great toe with good healing.  She would like to have the same procedure performed down to the right great toe.  Patient presents today for further treatment and evaluation. ? ?Past Medical History:  ?Diagnosis Date  ? Medical history non-contributory   ? ? ?Objective:  ?General: Well developed, nourished, in no acute distress, alert and oriented x3  ? ?Dermatology: Skin is warm, dry and supple bilateral.  Lateral border right great toe appears to be erythematous with evidence of an ingrowing nail. Pain on palpation noted to the border of the nail fold. The remaining nails appear unremarkable at this time. There are no open sores, lesions. ? ?Vascular: Dorsalis Pedis artery and Posterior Tibial artery pedal pulses palpable. No lower extremity edema noted.  ? ?Neruologic: Grossly intact via light touch bilateral. ? ?Musculoskeletal: Muscular strength within normal limits in all groups bilateral. Normal range of motion noted to all pedal and ankle joints.  ? ?Assesement: ?#1 Paronychia with ingrowing nail lateral border right great toe ?#2 Pain in toe ?#3 H/o partial nail matricectomy lateral border LT great toe ? ?Plan of Care:  ?1. Patient evaluated.  ?2. Discussed treatment alternatives and plan of care. Explained nail avulsion procedure and post procedure course to patient. ?3. Patient opted for permanent partial nail avulsion of the ingrown portion of the nail.  ?4. Prior to procedure, local anesthesia infiltration utilized using 3 ml of a 50:50 mixture of 2% plain lidocaine and 0.5% plain marcaine in a normal hallux block fashion and a betadine prep performed.  ?5. Partial permanent nail avulsion with chemical matrixectomy performed using 3x30sec  applications of phenol followed by alcohol flush.  ?6. Light dressing applied.  Post care instructions provided ?7.  Prescription for gentamicin 2% cream  ?8.  Return to clinic 2 weeks. ? ?Felecia Shelling, DPM ?[facility] ? ?Dr. Felecia Shelling, DPM  ?  ?[address]                                       ?[place], [place] [address]                ?Office [phone]  ?Fax [phone] ? ? ? ? ?

## 2021-05-29 ENCOUNTER — Ambulatory Visit: Payer: Managed Care, Other (non HMO) | Admitting: Podiatry

## 2021-06-21 ENCOUNTER — Ambulatory Visit: Payer: Managed Care, Other (non HMO) | Admitting: Podiatry

## 2021-06-21 ENCOUNTER — Other Ambulatory Visit: Payer: Self-pay | Admitting: Obstetrics and Gynecology

## 2021-06-21 DIAGNOSIS — L600 Ingrowing nail: Secondary | ICD-10-CM | POA: Diagnosis not present

## 2021-06-21 DIAGNOSIS — Z1239 Encounter for other screening for malignant neoplasm of breast: Secondary | ICD-10-CM

## 2021-06-21 NOTE — Progress Notes (Signed)
? ?  Subjective: ?41 y.o. female presents today status post permanent nail avulsion procedure of the lateral border of the right great toe that was performed on 05/10/2021.  Patient states that she is doing well.  She did have some slight sensitivity so she thought she would come in for follow-up evaluation.  ? ?Past Medical History:  ?Diagnosis Date  ? Medical history non-contributory   ? ? ?Objective: ?Skin is warm, dry and supple. Nail and respective nail fold appears to be healing appropriately.  No open wound.  The nail matricectomy site appears nicely healed.  There is some slight debris to the area however there is no erythema or concern for infection.  No drainage. ? ?Assessment: ?#1 s/p partial permanent nail matrixectomy lateral border right great toe ?#2 h/o partial nail matricectomy lateral border left great toe ? ? ?Plan of care: ?#1 patient was evaluated  ?#2 light debridement of open wound was performed to the periungual border of the respective toe using a currette.  ?#3 patient is to return to clinic on a PRN basis. ? ? ?Felecia Shelling, DPM ?[facility] ? ?Dr. Felecia Shelling, DPM  ?  ?[address]                                      ?[place], [place] [address]                ?Office [phone]  ?Fax [phone] ? ? ? ? ?

## 2021-10-03 ENCOUNTER — Encounter: Payer: Self-pay | Admitting: Plastic Surgery

## 2021-10-03 ENCOUNTER — Ambulatory Visit (INDEPENDENT_AMBULATORY_CARE_PROVIDER_SITE_OTHER): Payer: Self-pay | Admitting: Plastic Surgery

## 2021-10-03 DIAGNOSIS — Z719 Counseling, unspecified: Secondary | ICD-10-CM

## 2021-10-03 NOTE — Progress Notes (Signed)
Patient ID: Cindy Hendricks, female    DOB: 08/19/1980, 41 y.o.   MRN: [id number]   Chief Complaint  Patient presents with   Consult         The patient is a 41 year old female here for evaluation of her breasts.  Approximately 20 years ago she had implants placed in her breasts for 4 years.  She took them out and then decided to have them back in.  She has now had silicone implants in for 14 years.  This was done in [place].  She started with a 30 to be and she is now a 60 DD.  She thinks that they are below the muscle but not for sure.  She is not sure of the size but she is going to try and find out.  She complains of left chest wall pain.  She is not a smoker and does not have diabetes.  She has a maternal aunt that has a history of breast cancer.  Her mammogram was in May 2023 and was negative.  She is 5 feet 2 inches tall and weighs 112 pounds.  She has an inframammary incision that is quite long on both sides and a periareolar incision.  She says the implants were put through the inframammary incision the first time and periareolar the second time.  I did not palpate any concerning areas or contracture in either breast.    Review of Systems  Constitutional: Negative.   Eyes: Negative.   Respiratory: Negative.  Negative for chest tightness.        Tenderness of the left chest wall to palpation.  Cardiovascular: Negative.   Gastrointestinal: Negative.   Endocrine: Negative.   Genitourinary: Negative.   Musculoskeletal: Negative.   Skin:  Positive for color change and wound.    Past Medical History:  Diagnosis Date   Medical history non-contributory     Past Surgical History:  Procedure Laterality Date   BREAST SURGERY     CESAREAN SECTION        Current Outpatient Medications:    acetaminophen (TYLENOL) 500 MG tablet, Take 1,000 mg by mouth every 6 (six) hours as needed for mild pain or fever. , Disp: , Rfl:    ascorbic acid (VITAMIN C) 250 MG tablet, Take by  mouth., Disp: , Rfl:    Biotin 1000 MCG tablet, Take by mouth., Disp: , Rfl:    cyanocobalamin 100 MCG tablet, Take by mouth., Disp: , Rfl:    dicloxacillin (DYNAPEN) 500 MG capsule, TK 1 C PO Q 8 H FOR 10 DAYS, Disp: , Rfl:    ferrous sulfate 325 (65 FE) MG tablet, Take 325 mg by mouth daily with breakfast., Disp: , Rfl:    gentamicin cream (GARAMYCIN) 0.1 %, Apply 1 application topically 2 (two) times daily., Disp: 15 g, Rfl: 1   ibuprofen (ADVIL,MOTRIN) 800 MG tablet, Take 1 tablet (800 mg total) by mouth every 8 (eight) hours as needed for moderate pain., Disp: 45 tablet, Rfl: 1   levonorgestrel-ethinyl estradiol (ALESSE) 0.1-20 MG-MCG tablet, Sronyx 0.1 mg-20 mcg tablet  TAKE 1 TABLET BY MOUTH EVERY DAY, Disp: , Rfl:    methylPREDNISolone (MEDROL DOSEPAK) 4 MG TBPK tablet, Take as directed, Disp: 21 tablet, Rfl: 0   Multiple Vitamin (THERA) TABS, Take 1 tablet by mouth daily., Disp: , Rfl:    norethindrone (MICRONOR) 0.35 MG tablet, Take 1 tablet by mouth daily., Disp: , Rfl:    ondansetron (ZOFRAN-ODT) 8 MG disintegrating  tablet, TAKE 1 TABLET EVERY 8 HOURS AS NEEDED FOR NAUSEA AND VOMITING, Disp: , Rfl:    oxyCODONE-acetaminophen (PERCOCET/ROXICET) 5-325 MG tablet, Take 1-2 tablets by mouth every 6 (six) hours as needed for severe pain., Disp: 15 tablet, Rfl: 0   Prenatal Vit-Fe Fumarate-FA (PRENATAL MULTIVITAMIN) TABS tablet, Take 1 tablet by mouth daily at 12 noon., Disp: 100 tablet, Rfl: 2   tiZANidine (ZANAFLEX) 2 MG tablet, Take by mouth., Disp: , Rfl:    zinc gluconate 50 MG tablet, Take by mouth., Disp: , Rfl:    Objective:   Vitals:   10/03/21 0953  BP: 109/76  Pulse: 79  SpO2: 100%    Physical Exam Vitals reviewed.  Constitutional:      Appearance: Normal appearance.  HENT:     Head: Normocephalic and atraumatic.  Cardiovascular:     Rate and Rhythm: Normal rate.     Pulses: Normal pulses.  Pulmonary:     Effort: Pulmonary effort is normal.  Abdominal:      General: There is no distension.     Palpations: Abdomen is soft.  Musculoskeletal:        General: No swelling or deformity.  Skin:    General: Skin is warm.     Capillary Refill: Capillary refill takes less than 2 seconds.     Coloration: Skin is not jaundiced.     Findings: No bruising.  Neurological:     Mental Status: She is alert and oriented to person, place, and time.  Psychiatric:        Mood and Affect: Mood normal.        Behavior: Behavior normal.        Thought Content: Thought content normal.        Judgment: Judgment normal.     Assessment & Plan:  Encounter for counseling  Recommend being real diligent with mammograms.  Patient is likely going to need the implants changed in the next year or 2 since they are over 37 years old.  I have recommended her to come back in 1 year for follow-up.  She is in agreement.    ICD-10-CM   1. Encounter for counseling  Z71.9       Pictures were obtained of the patient and placed in the chart with the patient's or guardian's permission.   Alena Bills Nuala Chiles, DO

## 2022-06-08 ENCOUNTER — Encounter: Payer: Commercial Managed Care - HMO | Admitting: Obstetrics & Gynecology

## 2022-09-18 IMAGING — DX DG HAND 2V*R*
2 series · 2 of 2 positions shown · non-contrast
Comparison: None.

CLINICAL DATA: Middle finger swelling and pain. Genralzied hand
pain.

EXAM:
RIGHT HAND - 2 VIEW

[hand ap]
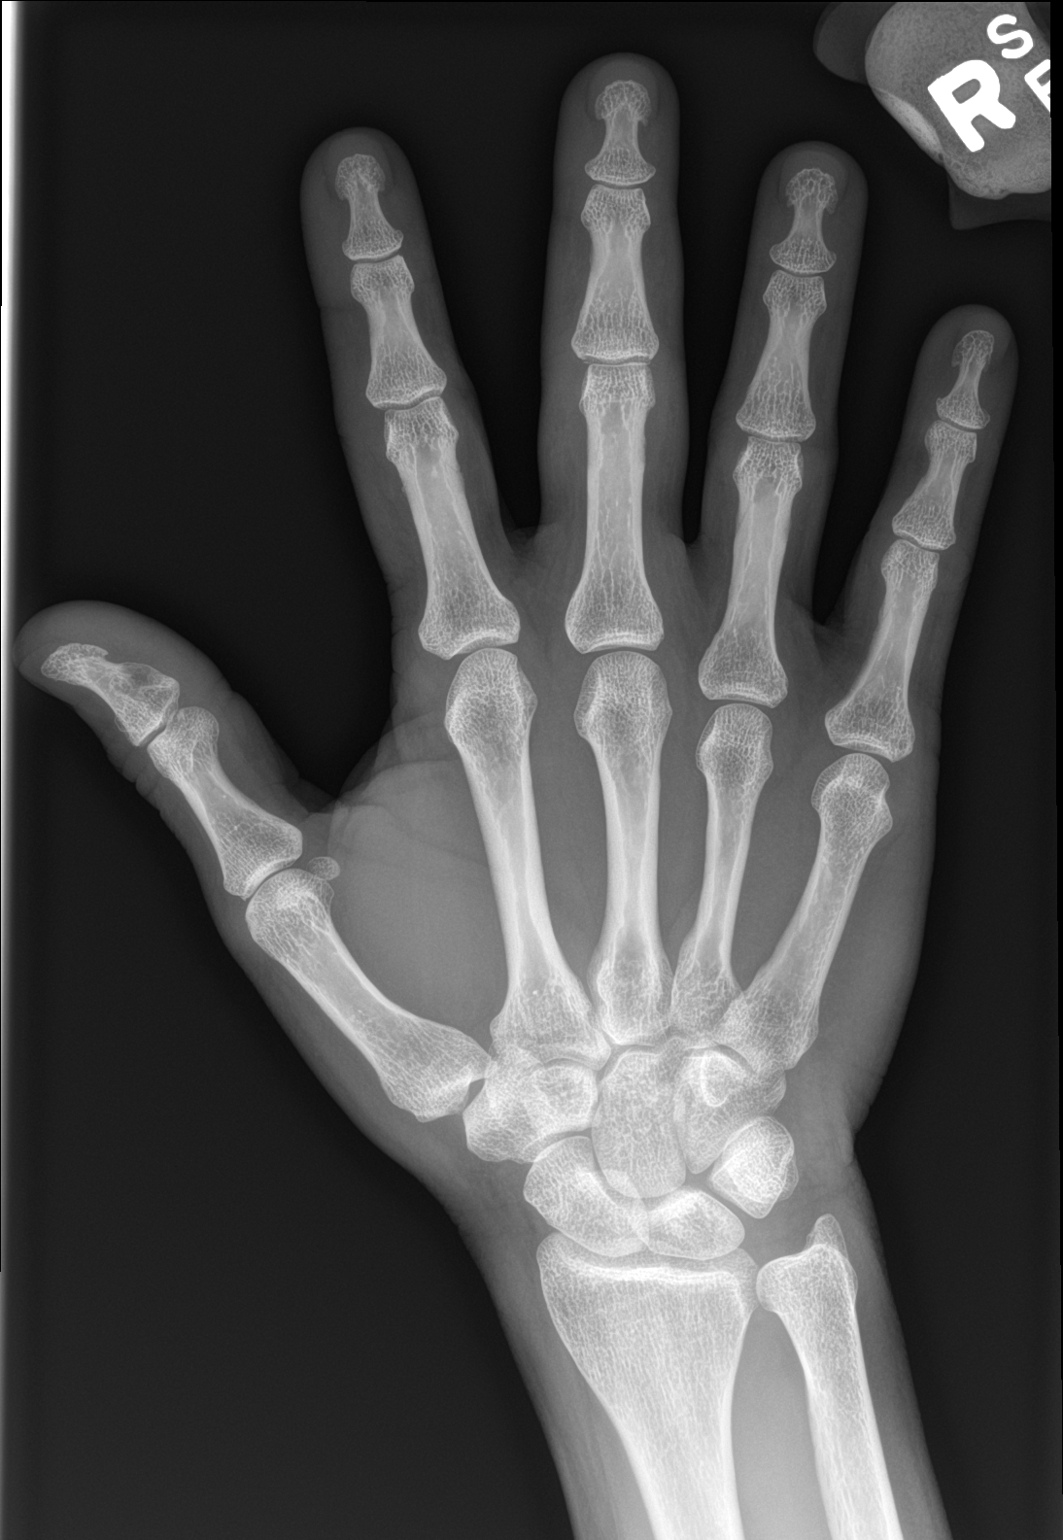

[hand lat]
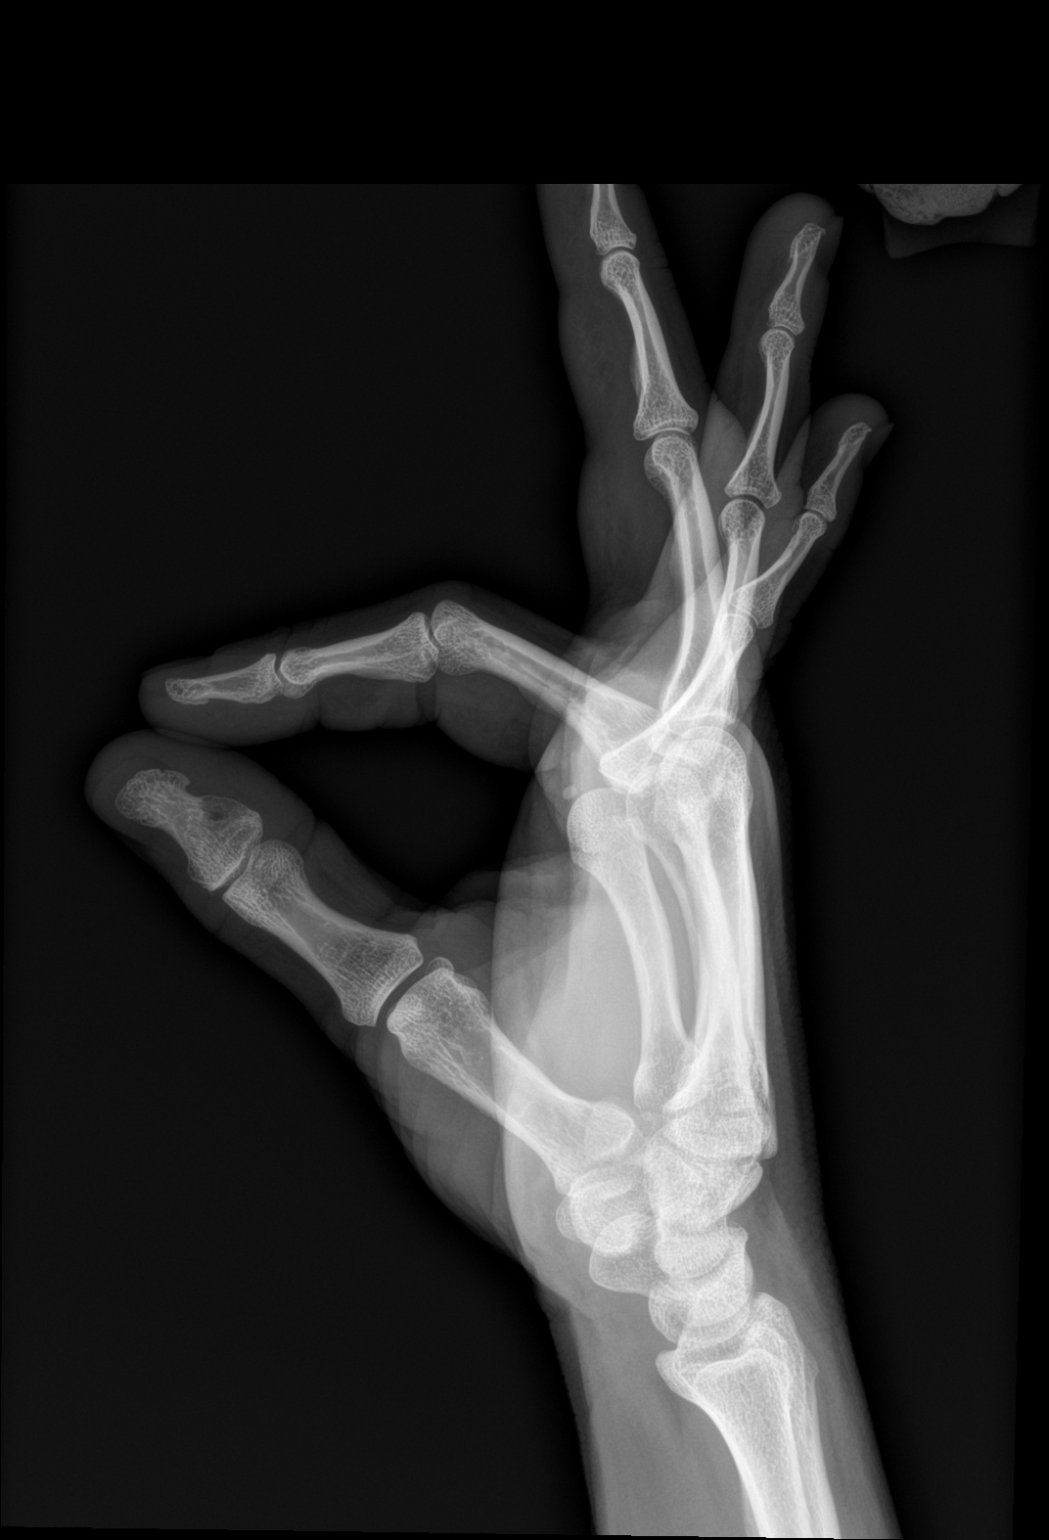

[2 of 2 positions shown; findings below may reference images not displayed]

FINDINGS: Normal alignment. No acute fracture. Normal mineralization. Minimal
soft tissue swelling at the base of the third digit.
IMPRESSION: Minimal soft tissue swelling of the third digit without underlying
acute osseous abnormality.

## 2023-02-04 ENCOUNTER — Ambulatory Visit: Payer: Commercial Managed Care - HMO | Admitting: Podiatry

## 2023-02-13 ENCOUNTER — Ambulatory Visit: Payer: Commercial Managed Care - HMO | Admitting: Podiatry

## 2023-03-06 ENCOUNTER — Encounter: Payer: Self-pay | Admitting: Podiatry

## 2023-03-06 ENCOUNTER — Ambulatory Visit (INDEPENDENT_AMBULATORY_CARE_PROVIDER_SITE_OTHER): Payer: Commercial Managed Care - HMO | Admitting: Podiatry

## 2023-03-06 DIAGNOSIS — L600 Ingrowing nail: Secondary | ICD-10-CM

## 2023-03-06 NOTE — Patient Instructions (Signed)

## 2023-03-06 NOTE — Progress Notes (Signed)
   Chief Complaint  Patient presents with   Ingrown Toenail    Patient states she has two ingrown on bilateral feet on her 2nd toes , it has been like this for long time and when she touches it is sore to the touch . No medication for pain .     Subjective: Patient presents today for evaluation of pain to the lateral border of the second toe bilateral. Patient is concerned for possible ingrown nail.  It is very sensitive to touch.  Patient presents today for further treatment and evaluation.  Past Medical History:  Diagnosis Date   Medical history non-contributory     Past Surgical History:  Procedure Laterality Date   BREAST SURGERY     CESAREAN SECTION      Allergies  Allergen Reactions   Clindamycin /Lincomycin Rash    Full body rash    Objective:  General: Well developed, nourished, in no acute distress, alert and oriented x3   Dermatology: Skin is warm, dry and supple bilateral.  Lateral border second toe bilateral is tender with evidence of an ingrowing nail. Pain on palpation noted to the border of the nail fold. The remaining nails appear unremarkable at this time.   Vascular: DP and PT pulses palpable.  No clinical evidence of vascular compromise  Neruologic: Grossly intact via light touch bilateral.  Musculoskeletal: No pedal deformity noted  Assesement: #1 Paronychia with ingrowing nail lateral border second toe bilateral #2 h/o partial nail matricectomy B/L great toe LAT  Plan of Care:  -Patient evaluated.  -Discussed treatment alternatives and plan of care. Explained nail avulsion procedure and post procedure course to patient. -Patient opted for permanent partial nail avulsion of the ingrown portion of the nail.  -Prior to procedure, local anesthesia infiltration utilized using 3 ml of a 50:50 mixture of 2% plain lidocaine  and 0.5% plain marcaine  in a normal hallux block fashion and a betadine prep performed.  -Partial permanent nail avulsion with chemical  matrixectomy performed using 3x30sec applications of phenol followed by alcohol flush.  -Light dressing applied.  Post care instructions provided -Return to clinic 3 weeks  *from [place]  Kenna Kirn M. Brees Hounshell, DPM [facility]  Dr. Thresa EMERSON Sar, DPM    [address] [place], [place] [address]                Office [phone]  Fax [phone]

## 2023-04-03 ENCOUNTER — Encounter: Payer: Self-pay | Admitting: Podiatry

## 2023-04-03 ENCOUNTER — Ambulatory Visit: Payer: Commercial Managed Care - HMO | Admitting: Podiatry

## 2023-04-03 VITALS — Ht 62.0 in | Wt 112.0 lb

## 2023-04-03 DIAGNOSIS — L600 Ingrowing nail: Secondary | ICD-10-CM | POA: Diagnosis not present

## 2023-04-03 MED ORDER — DOXYCYCLINE HYCLATE 100 MG PO TABS: 100.0000 mg | ORAL_TABLET | Freq: Two times a day (BID) | ORAL | 0 refills | Status: AC

## 2023-04-03 NOTE — Progress Notes (Signed)
   Chief Complaint  Patient presents with   Ingrown Toenail    Pt is here to f/u on ingrown's removed from the 2nd toes on bilateral feet. Pt states both toes are a lot better.    Subjective: Patient presents today for follow-up evaluation of partial nail matricectomy's to the lateral border of the second digits bilateral.  She says that the second toes are doing very well.  She says that since last visit she has began to experience pain and tenderness to the lateral border of the third toes now.  She says that intermittently she has had ingrown toenails to these areas as well and she would like to have them corrected for  Past Medical History:  Diagnosis Date   Medical history non-contributory     Past Surgical History:  Procedure Laterality Date   BREAST SURGERY     CESAREAN SECTION      Allergies  Allergen Reactions   Clindamycin /Lincomycin Rash    Full body rash    Objective:  General: Well developed, nourished, in no acute distress, alert and oriented x3   Dermatology: Skin is warm, dry and supple bilateral.  Lateral border third toes bilateral is tender with evidence of an ingrowing nail. Pain on palpation noted to the border of the nail fold. The remaining nails appear unremarkable at this time.  Routine healing noted to the lateral border the bilateral great toes as well as the second digits bilateral  Vascular: DP and PT pulses palpable.  No clinical evidence of vascular compromise  Neruologic: Grossly intact via light touch bilateral.  Musculoskeletal: No pedal deformity noted  Assesement: #1 Paronychia with ingrowing nail lateral border third digits bilateral #2 h/o partial nail matricectomy B/L great toe LAT, B/L 2nd toe LAT   Plan of Care:  -Patient evaluated.  -Discussed treatment alternatives and plan of care. Explained nail avulsion procedure and post procedure course to patient. -Patient opted for permanent partial nail avulsion of the ingrown portion of  the nail today of the lateral border of the third digits bilateral.  -Prior to procedure, local anesthesia infiltration utilized using 3 ml of a 50:50 mixture of 2% plain lidocaine  and 0.5% plain marcaine  in a normal hallux block fashion and a betadine prep performed.  -Partial permanent nail avulsion with chemical matrixectomy performed using 3x30sec applications of phenol followed by alcohol flush.  -Light dressing applied.  Post care instructions provided -Prescription for doxycycline  100 mg twice daily x 10 days sent prophylactically -Return to clinic 3 weeks  *from [place]  July Nickson M. Ivalee Strauser, DPM [facility]  Dr. Thresa EMERSON Sar, DPM    [address] [place], [place] [address]                Office [phone]  Fax [phone]

## 2023-04-17 ENCOUNTER — Telehealth: Payer: Self-pay | Admitting: Podiatry

## 2023-04-17 ENCOUNTER — Other Ambulatory Visit: Payer: Self-pay

## 2023-04-17 MED ORDER — DOXYCYCLINE HYCLATE 100 MG PO TABS: 100.0000 mg | ORAL_TABLET | Freq: Two times a day (BID) | ORAL | 0 refills | Status: AC

## 2023-04-17 NOTE — Telephone Encounter (Signed)
 It was sent into the wrong pharmacy. I took out the old pharmacy if it could be resent to the correct pharmacy please.

## 2023-04-17 NOTE — Telephone Encounter (Signed)
 Notified pt medication was sent to correct pharmacy as it was sent to wrong pharmacy at last visit.

## 2023-04-17 NOTE — Telephone Encounter (Signed)
 Pt called and was seen 2/5 and an antibiotic was to have been sent in and the pharmacy has still not received it. Please let me know and I will call pt.

## 2023-04-29 ENCOUNTER — Ambulatory Visit: Payer: Commercial Managed Care - HMO | Admitting: Podiatry

## 2023-07-11 ENCOUNTER — Telehealth: Payer: Self-pay | Admitting: Podiatry

## 2023-07-11 NOTE — Telephone Encounter (Signed)
 FYI, Patient claims when toe nails were clipped by provider it was not cut completely and she is not willing to pay, copay to see provider for same issue for future visit. She feels, she should not be charged due to the issue reoccurring.
